# Patient Record
Sex: Female | Born: 1955 | Race: White | Hispanic: No | Smoking: Former smoker
Health system: Southern US, Community
[De-identification: ages and names within clinical notes are randomized; demographics above are authoritative.]

## PROBLEM LIST (undated history)

## (undated) DIAGNOSIS — F419 Anxiety disorder, unspecified: Secondary | ICD-10-CM

## (undated) DIAGNOSIS — R918 Other nonspecific abnormal finding of lung field: Secondary | ICD-10-CM

## (undated) DIAGNOSIS — E785 Hyperlipidemia, unspecified: Secondary | ICD-10-CM

## (undated) DIAGNOSIS — I739 Peripheral vascular disease, unspecified: Secondary | ICD-10-CM

## (undated) DIAGNOSIS — K8020 Calculus of gallbladder without cholecystitis without obstruction: Secondary | ICD-10-CM

## (undated) DIAGNOSIS — I2510 Atherosclerotic heart disease of native coronary artery without angina pectoris: Secondary | ICD-10-CM

## (undated) DIAGNOSIS — G4700 Insomnia, unspecified: Secondary | ICD-10-CM

## (undated) DIAGNOSIS — K219 Gastro-esophageal reflux disease without esophagitis: Secondary | ICD-10-CM

## (undated) DIAGNOSIS — M21619 Bunion of unspecified foot: Secondary | ICD-10-CM

## (undated) HISTORY — PX: OTHER SURGICAL HISTORY: SHX169

## (undated) HISTORY — DX: Hyperlipidemia, unspecified: E78.5

## (undated) HISTORY — DX: Atherosclerotic heart disease of native coronary artery without angina pectoris: I25.10

## (undated) HISTORY — DX: Anxiety disorder, unspecified: F41.9

## (undated) HISTORY — PX: EYE SURGERY: SHX253

## (undated) HISTORY — PX: CARDIAC CATHETERIZATION: SHX172

## (undated) HISTORY — PX: CORONARY STENT PLACEMENT: SHX1402

## (undated) HISTORY — DX: Other nonspecific abnormal finding of lung field: R91.8

## (undated) HISTORY — PX: BUNIONECTOMY: SHX129

## (undated) HISTORY — DX: Bunion of unspecified foot: M21.619

## (undated) HISTORY — DX: Insomnia, unspecified: G47.00

---

## 2012-03-02 ENCOUNTER — Other Ambulatory Visit (HOSPITAL_COMMUNITY)
Admission: RE | Admit: 2012-03-02 | Discharge: 2012-03-02 | Disposition: A | Discharge status: Home / Self Care | Payer: No Typology Code available for payment source | Source: Ambulatory Visit | Attending: Obstetrics and Gynecology | Admitting: Obstetrics and Gynecology

## 2012-03-02 DIAGNOSIS — Z124 Encounter for screening for malignant neoplasm of cervix: Secondary | ICD-10-CM | POA: Insufficient documentation

## 2016-06-03 DIAGNOSIS — F5101 Primary insomnia: Secondary | ICD-10-CM | POA: Insufficient documentation

## 2016-06-03 DIAGNOSIS — I2510 Atherosclerotic heart disease of native coronary artery without angina pectoris: Secondary | ICD-10-CM | POA: Insufficient documentation

## 2016-06-03 DIAGNOSIS — E7800 Pure hypercholesterolemia, unspecified: Secondary | ICD-10-CM | POA: Insufficient documentation

## 2016-06-03 DIAGNOSIS — I739 Peripheral vascular disease, unspecified: Secondary | ICD-10-CM | POA: Insufficient documentation

## 2016-07-09 DIAGNOSIS — I6523 Occlusion and stenosis of bilateral carotid arteries: Secondary | ICD-10-CM | POA: Insufficient documentation

## 2016-07-09 DIAGNOSIS — M21611 Bunion of right foot: Secondary | ICD-10-CM | POA: Insufficient documentation

## 2016-07-22 DIAGNOSIS — I2510 Atherosclerotic heart disease of native coronary artery without angina pectoris: Secondary | ICD-10-CM | POA: Insufficient documentation

## 2016-07-22 DIAGNOSIS — I70219 Atherosclerosis of native arteries of extremities with intermittent claudication, unspecified extremity: Secondary | ICD-10-CM | POA: Insufficient documentation

## 2016-07-22 DIAGNOSIS — Z95828 Presence of other vascular implants and grafts: Secondary | ICD-10-CM | POA: Insufficient documentation

## 2016-07-22 DIAGNOSIS — E785 Hyperlipidemia, unspecified: Secondary | ICD-10-CM | POA: Insufficient documentation

## 2016-10-10 DIAGNOSIS — I249 Acute ischemic heart disease, unspecified: Secondary | ICD-10-CM | POA: Insufficient documentation

## 2016-10-10 DIAGNOSIS — R918 Other nonspecific abnormal finding of lung field: Secondary | ICD-10-CM | POA: Insufficient documentation

## 2016-10-14 DIAGNOSIS — R079 Chest pain, unspecified: Secondary | ICD-10-CM | POA: Insufficient documentation

## 2016-10-29 ENCOUNTER — Institutional Professional Consult (permissible substitution) (INDEPENDENT_AMBULATORY_CARE_PROVIDER_SITE_OTHER): Payer: 59 | Admitting: Cardiothoracic Surgery

## 2016-10-29 ENCOUNTER — Encounter: Payer: Self-pay | Admitting: Cardiothoracic Surgery

## 2016-10-29 VITALS — BP 102/79 | HR 64 | Resp 16 | Ht 63.5 in | Wt 137.0 lb

## 2016-10-29 DIAGNOSIS — R918 Other nonspecific abnormal finding of lung field: Secondary | ICD-10-CM

## 2016-10-29 NOTE — Progress Notes (Signed)
PCP is Windell Hummingbird, PA-C Referring Provider is Gwenevere Ghazi, MD  Chief Complaint  Patient presents with  . Lung Mass    Surgical eval, PET Scan 10/17/16.Marland KitchenMarland KitchenCT C/A/P 10/10/16, MR BRAIN 10/22/16, PFT 10/17/16   Patient examined, outside CT scan of chest and PET scan and PFT report and brain MRI report personally reviewed and counseled with patient  HPI: Very nice active 61 year old Caucasian female presents for evaluation and therapy recently diagnosed 3 cm right lower lobe density with concern for primary lung cancer. The patient is a reformed smoker, stopped in 2008. At that time she was diagnosed with coronary artery disease and angina and underwent PCI of her LAD. She stopped smoking at that time.  Last month she presented to the [facility] for evaluation of anterior and posterior chest pain with radiation to her neck and jaw relieved by nitroglycerin. She was admitted for chest pain and to cycle cardiac enzymes all of which were negative. Her chest x-ray showed a abnormality in the right lung and subsequent CT scan of the chest was performed which showed the 3 cm density in the right lower lobe. She was discharged to be followed up by her cardiologist and by her pulmonologist Dr. Gwenevere Ghazi.  On approximately April 2 she underwent coronary angiography by Dr. Darral Dash at [facility] which showed a widely patent LAD stent normal coronaries and normal LV function according to the patient. Records will be obtained.  She is evaluated by Dr. Gwenevere Ghazi with PET scan which showed the right lower lobe density to have an SUV of 4.3 SUV. There is no evidence of other pulmonary or mediastinal nodal or distant foci of hypermetabolic activity. Brain MRI report is negative for metastatic disease. Morphologically the lesion is suspicious for a slow-growing adenocarcinoma. The patient presents today to discuss surgical resection. She is not interested and a needle biopsy to  establish the diagnosis first but would rather have a resection to definitively treat the abnormality.  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 She is on inhalers. She denies any active problems of shortness of breath productive cough hemoptysis or weight loss fever. There is no family history of lung cancer.  Patient denies previous history of pneumothorax, rib fracture, or aspiration. She works as a Optometrist. She enjoys and is active in her yard work. She also enjoys motorcycle rides.  2012 the patient had a stent placed in the left iliac artery. Within the past 3 months the patient had right bunion surgery and has recovered well.  Past Medical History:  Diagnosis Date  . Anxiety   . Bunion   . CAD (coronary artery disease)   . Hyperlipidemia   . Hypertension   . Insomnia   . Lung mass     Past Surgical History:  Procedure Laterality Date  . BUNIONECTOMY    . CARDIAC CATHETERIZATION    . CESAREAN SECTION    . CORONARY STENT PLACEMENT    . EYE SURGERY    . stent left leg 2012      Family History  Problem Relation Age of Onset  . Cancer Mother   . Diabetes Mother   . Heart disease Mother   . Hyperlipidemia Mother   . Hypertension Mother   . Kidney disease Mother   . Stroke Mother   . Cancer Father   . Stroke Father   . Heart disease Brother   . Diabetes Maternal Grandmother   . Hyperlipidemia  Maternal Grandmother   . Hypertension Maternal Grandmother   . Kidney disease Maternal Grandmother   . Heart disease Maternal Grandfather     Social History Social History  Substance Use Topics  . Smoking status: Former Smoker    Packs/day: 0.75    Years: 35.00    Quit date: 10/30/2006  . Smokeless tobacco: Never Used  . Alcohol use No    Current Outpatient Prescriptions  Medication Sig Dispense Refill  . ALPRAZolam (XANAX) 0.5 MG tablet TAKE ONE TABLET HS    . ANORO ELLIPTA 62.5-25 MCG/INH AEPB     . aspirin EC 81 MG tablet Take by mouth.     . gabapentin (NEURONTIN) 300 MG capsule Take 900 mg by mouth.    . metoprolol succinate (TOPROL-XL) 25 MG 24 hr tablet     . rosuvastatin (CRESTOR) 20 MG tablet nightly.      No current facility-administered medications for this visit.     Allergies  Allergen Reactions  . Lipitor [Atorvastatin]     MUSCLE ACHES    Review of Systems         Review of Systems :  [ y ] = yes, [  ] = no        General :  Weight gain [ yes  ]    Weight loss  [ no  ]  Fatigue [no  ]  Fever [no  ]  Chills  [ no ]                                Weakness  [ no           HEENT    Headache [  ]  Dizziness [  ]  Blurred vision [  ] Glaucoma  [  ]                          Nosebleeds [  ] Painful or loose teeth [  ]        Cardiac :  Chest pain/ pressure [ yes resolved ]  Resting SOB [  ] exertional SOB [  ]                        Orthopnea [  ]  Pedal edema  [  ]  Palpitations [  ] Syncope/presyncope '[ ]'$                         Paroxysmal nocturnal dyspnea [  ]         Pulmonary : cough [  ]  wheezing [  ]  Hemoptysis [  ] Sputum [  ] Snoring [  ]                              Pneumothorax [  ]  Sleep apnea [  ]        GI : Vomiting [  ]  Dysphagia [  ]  Melena  [  ]  Abdominal pain [  ] BRBPR [  ]              Heart burn [  ]  Constipation [  ] Diarrhea  [  ] Colonoscopy [ yes  ]        GU :  Hematuria [  ]  Dysuria [  ]  Nocturia [  ] UTI's [  ]        Vascular : Claudication [  ]  Rest pain [  ]  DVT [  ] Vein stripping [  ] leg ulcers [  ]                          TIA [  ] Stroke [  ]  Varicose veins [  ]left iliac stent 6 years ago without claudication        NEURO :  Headaches  [  ] Seizures [  ] Vision changes [  ] Paresthesias [  ]                                       Seizures [  ] right-hand-dominant        Musculoskeletal :  Arthritis [  ] Gout  [  ]  Back pain [  ]  Joint pain [  ]        Skin :  Rash [  ]  Melanoma [  ] Sores [  ]        Heme : Bleeding problems [  ]Clotting Disorders [  ]  Anemia [  ]Blood Transfusion '[ ]'$         Endocrine : Diabetes [  ] Heat or Cold intolerance [  ] Polyuria [  ]excessive thirst '[ ]'$         Psych : Depression [  ]  Anxiety [  ]  Psych hospitalizations [  ] Memory change [  ]                                               BP 102/79 (BP Location: Left Arm, Patient Position: Sitting, Cuff Size: Normal)   Pulse 64   Resp 16   Ht 5' 3.5" (1.613 m)   Wt 137 lb (62.1 kg)   SpO2 98% Comment: ON RA  BMI 23.89 kg/m  Physical Exam      Physical Exam  General: Very dynamic middle-aged female no acute distress HEENT: Normocephalic pupils equal , dentition adequate Neck: Supple without JVD, adenopathy, or bruit Chest: Clear to auscultation, symmetrical breath sounds, no rhonchi, no tenderness             or deformity Cardiovascular: Regular rate and rhythm, no murmur, no gallop, peripheral pulses             palpable in all extremities Abdomen:  Soft, nontender, no palpable mass or organomegaly Extremities: Warm, well-perfused, no clubbing cyanosis edema or tenderness,              no venous stasis changes of the legs, well-healed incision over right great toe bunion operation Rectal/GU: Deferred Neuro: Grossly non--focal and symmetrical throughout Skin: Clean and dry without rash or ulceration   Diagnostic Tests: CT scan, PET scan demonstrate a complex right lower lobe density with solid and sub- solid components. No evidence of mediastinal or distant metastatic disease. Appears to be probable early stage adenocarcinoma primary tumor.  Impression: We will need to obtain the cardiology records for this patient prior to surgery The patient will  be scheduled for full PFTs Right VATS and probable lobectomy will be scheduled for April 23 at [facility] hospital. I discussed the procedure in detail with the patient including the indications benefits and risks of bleeding, blood transfusion, postoperative air leak, postoperative pleural effusion,  and death. She agrees to proceed with surgery which I feel is the best option for treatment of this newly diagnosed 2-3 cm right lower lobe density  Plan: Preoperative testing with PFTs then VATS for resection April 23   Len Childs, MD [facility] [phone]

## 2016-10-30 ENCOUNTER — Other Ambulatory Visit: Payer: Self-pay | Admitting: *Deleted

## 2016-10-30 DIAGNOSIS — R911 Solitary pulmonary nodule: Secondary | ICD-10-CM

## 2016-10-31 NOTE — Pre-Procedure Instructions (Addendum)
[facility]  [birth year]      [id number] [place], [place] - [address] [place] [address] Phone: [phone] Fax: [phone]    Your procedure is scheduled on   Monday  11/04/16  Report to [facility] Admitting at 530 A.M.  Call this number if you have problems the morning of surgery:  [phone]   Remember:  Do not eat food or drink liquids after midnight.  Take these medicines the morning of surgery with A SIP OF WATER  none   STOP ASPIRIN,ANTIINFLAMATORIES (IBUPROFEN,ALEVE,MOTRIN,ADVIL,GOODY'S POWDERS),HERBAL SUPPLEMENTS,FISH OIL,AND VITAMINS 5-7 DAYS PRIOR TO SURGERY   Do not wear jewelry, make-up or nail polish.  Do not wear lotions, powders, or perfumes, or deoderant.  Do not shave 48 hours prior to surgery.  Men may shave face and neck.  Do not bring valuables to the hospital.  [facility] is not responsible for any belongings or valuables.  Contacts, dentures or bridgework may not be worn into surgery.  Leave your suitcase in the car.  After surgery it may be brought to your room.  For patients admitted to the hospital, discharge time will be determined by your treatment team.  Patients discharged the day of surgery will not be allowed to drive home.   Name and phone number of your driver:    Special instructions:  [facility] - Preparing for Surgery  Before surgery, you can play an important role.  Because skin is not sterile, your skin needs to be as free of germs as possible.  You can reduce the number of germs on you skin by washing with CHG (chlorahexidine gluconate) soap before surgery.  CHG is an antiseptic cleaner which kills germs and bonds with the skin to continue killing germs even after washing.  Please DO NOT use if you have an allergy to CHG or antibacterial soaps.  If your skin becomes reddened/irritated stop using the CHG and inform your nurse when you arrive at Short  Stay.  Do not shave (including legs and underarms) for at least 48 hours prior to the first CHG shower.  You may shave your face.  Please follow these instructions carefully:   1.  Shower with CHG Soap the night before surgery and the                                morning of Surgery.  2.  If you choose to wash your hair, wash your hair first as usual with your       normal shampoo.  3.  After you shampoo, rinse your hair and body thoroughly to remove the                      Shampoo.  4.  Use CHG as you would any other liquid soap.  You can apply chg directly       to the skin and wash gently with scrungie or a clean washcloth.  5.  Apply the CHG Soap to your body ONLY FROM THE NECK DOWN.        Do not use on open wounds or open sores.  Avoid contact with your eyes,       ears, mouth and genitals (private parts).  Wash genitals (private parts)       with your normal soap.  6.  Wash thoroughly, paying special attention to  the area where your surgery        will be performed.  7.  Thoroughly rinse your body with warm water from the neck down.  8.  DO NOT shower/wash with your normal soap after using and rinsing off       the CHG Soap.  9.  Pat yourself dry with a clean towel.            10.  Wear clean pajamas.            11.  Place clean sheets on your bed the night of your first shower and do not        sleep with pets.  Day of Surgery  Do not apply any lotions/deoderants the morning of surgery.  Please wear clean clothes to the hospital/surgery center.    Please read over the following fact sheets that you were given. MRSA Information and Surgical Site Infection Prevention

## 2016-11-01 ENCOUNTER — Ambulatory Visit (HOSPITAL_COMMUNITY)
Admission: RE | Admit: 2016-11-01 | Discharge: 2016-11-01 | Disposition: A | Discharge status: Home / Self Care | Payer: 59 | Source: Ambulatory Visit | Attending: Cardiothoracic Surgery | Admitting: Cardiothoracic Surgery

## 2016-11-01 ENCOUNTER — Encounter (HOSPITAL_COMMUNITY)
Admission: RE | Admit: 2016-11-01 | Discharge: 2016-11-01 | Disposition: A | Discharge status: Home / Self Care | Payer: 59 | Source: Ambulatory Visit | Attending: Cardiothoracic Surgery | Admitting: Cardiothoracic Surgery

## 2016-11-01 ENCOUNTER — Other Ambulatory Visit: Payer: Self-pay

## 2016-11-01 ENCOUNTER — Encounter (HOSPITAL_COMMUNITY): Payer: Self-pay

## 2016-11-01 DIAGNOSIS — R942 Abnormal results of pulmonary function studies: Secondary | ICD-10-CM

## 2016-11-01 DIAGNOSIS — R911 Solitary pulmonary nodule: Secondary | ICD-10-CM

## 2016-11-01 DIAGNOSIS — Z01818 Encounter for other preprocedural examination: Secondary | ICD-10-CM

## 2016-11-01 DIAGNOSIS — Z87891 Personal history of nicotine dependence: Secondary | ICD-10-CM

## 2016-11-01 DIAGNOSIS — J449 Chronic obstructive pulmonary disease, unspecified: Secondary | ICD-10-CM

## 2016-11-01 HISTORY — DX: Calculus of gallbladder without cholecystitis without obstruction: K80.20

## 2016-11-01 HISTORY — DX: Gastro-esophageal reflux disease without esophagitis: K21.9

## 2016-11-01 HISTORY — DX: Peripheral vascular disease, unspecified: I73.9

## 2016-11-01 LAB — PULMONARY FUNCTION TEST
DL/VA % pred: 76 %
DL/VA: 3.57 ml/min/mmHg/L
DLCO unc % pred: 70 %
DLCO unc: 16.07 ml/min/mmHg
FEF 25-75 Post: 1.48 L/sec
FEF 25-75 Pre: 1.69 L/sec
FEF2575-%Change-Post: -12 %
FEF2575-%Pred-Post: 65 %
FEF2575-%Pred-Pre: 74 %
FEV1-%Change-Post: -1 %
FEV1-%Pred-Post: 85 %
FEV1-%Pred-Pre: 87 %
FEV1-Post: 2.1 L
FEV1-Pre: 2.12 L
FEV1FVC-%Change-Post: -1 %
FEV1FVC-%Pred-Pre: 97 %
FEV6-%Change-Post: 0 %
FEV6-%Pred-Post: 90 %
FEV6-%Pred-Pre: 89 %
FEV6-Post: 2.75 L
FEV6-Pre: 2.74 L
FEV6FVC-%Change-Post: 0 %
FEV6FVC-%Pred-Post: 102 %
FEV6FVC-%Pred-Pre: 103 %
FVC-%Change-Post: 0 %
FVC-%Pred-Post: 88 %
FVC-%Pred-Pre: 87 %
FVC-Post: 2.79 L
FVC-Pre: 2.78 L
Post FEV1/FVC ratio: 75 %
Post FEV6/FVC ratio: 99 %
Pre FEV1/FVC ratio: 76 %
Pre FEV6/FVC Ratio: 99 %
RV % pred: 113 %
RV: 2.21 L
TLC % pred: 100 %
TLC: 4.91 L

## 2016-11-01 LAB — URINALYSIS, ROUTINE W REFLEX MICROSCOPIC
Bilirubin Urine: NEGATIVE
Glucose, UA: NEGATIVE mg/dL
Hgb urine dipstick: NEGATIVE
Ketones, ur: NEGATIVE mg/dL
Leukocytes, UA: NEGATIVE
Nitrite: NEGATIVE
Protein, ur: NEGATIVE mg/dL
Specific Gravity, Urine: 1.002 — ABNORMAL LOW (ref 1.005–1.030)
pH: 5 (ref 5.0–8.0)

## 2016-11-01 LAB — BLOOD GAS, ARTERIAL
Acid-base deficit: 1 mmol/L (ref 0.0–2.0)
Bicarbonate: 22.6 mmol/L (ref 20.0–28.0)
Drawn by: 449841
FIO2: 21
O2 Saturation: 97.1 %
Patient temperature: 98.6
pCO2 arterial: 33.9 mmHg (ref 32.0–48.0)
pH, Arterial: 7.439 (ref 7.350–7.450)
pO2, Arterial: 91.3 mmHg (ref 83.0–108.0)

## 2016-11-01 LAB — COMPREHENSIVE METABOLIC PANEL
ALT: 25 U/L (ref 14–54)
AST: 27 U/L (ref 15–41)
Albumin: 3.9 g/dL (ref 3.5–5.0)
Alkaline Phosphatase: 40 U/L (ref 38–126)
Anion gap: 8 (ref 5–15)
BUN: 12 mg/dL (ref 6–20)
CO2: 21 mmol/L — ABNORMAL LOW (ref 22–32)
Calcium: 9.6 mg/dL (ref 8.9–10.3)
Chloride: 110 mmol/L (ref 101–111)
Creatinine, Ser: 0.83 mg/dL (ref 0.44–1.00)
GFR calc Af Amer: >60 mL/min (ref >60)
GFR calc non Af Amer: >60 mL/min (ref >60)
Glucose, Bld: 103 mg/dL — ABNORMAL HIGH (ref 65–99)
Potassium: 4.1 mmol/L (ref 3.5–5.1)
Sodium: 139 mmol/L (ref 135–145)
Total Bilirubin: 0.6 mg/dL (ref 0.3–1.2)
Total Protein: 6.4 g/dL — ABNORMAL LOW (ref 6.5–8.1)

## 2016-11-01 LAB — CBC
HCT: 40.9 % (ref 36.0–46.0)
Hemoglobin: 13.4 g/dL (ref 12.0–15.0)
MCH: 29.8 pg (ref 26.0–34.0)
MCHC: 32.8 g/dL (ref 30.0–36.0)
MCV: 90.9 fL (ref 78.0–100.0)
Platelets: 233 10*3/uL (ref 150–400)
RBC: 4.5 MIL/uL (ref 3.87–5.11)
RDW: 13.5 % (ref 11.5–15.5)
WBC: 7 10*3/uL (ref 4.0–10.5)

## 2016-11-01 LAB — SURGICAL PCR SCREEN
MRSA, PCR: NEGATIVE
Staphylococcus aureus: POSITIVE — AB

## 2016-11-01 LAB — APTT: aPTT: 28 seconds (ref 24–36)

## 2016-11-01 LAB — PROTIME-INR
INR: 0.88
Prothrombin Time: 12 seconds (ref 11.4–15.2)

## 2016-11-01 LAB — ABO/RH: ABO/RH(D): A POS

## 2016-11-01 MED ORDER — ALBUTEROL SULFATE (2.5 MG/3ML) 0.083% IN NEBU
2.5000 mg | INHALATION_SOLUTION | Freq: Once | RESPIRATORY_TRACT | Status: AC
  Administered 2016-11-01: 2.5 mg via RESPIRATORY_TRACT

## 2016-11-01 NOTE — Progress Notes (Signed)
Dr. Gertie Fey   Cardiologist  Dr. Maryjean Morn  Vascular surgeon  Cardiac cath  2018  [facility]   Dr. Blenda Mounts  PCP [facility]

## 2016-11-01 NOTE — Progress Notes (Signed)
Pt. Notified of results of nasal swab.Px. For mupirocin called to [facility] on [address], [place]

## 2016-11-04 ENCOUNTER — Encounter (HOSPITAL_COMMUNITY): Payer: Self-pay | Admitting: *Deleted

## 2016-11-04 ENCOUNTER — Inpatient Hospital Stay (HOSPITAL_COMMUNITY): Payer: 59

## 2016-11-04 ENCOUNTER — Encounter (HOSPITAL_COMMUNITY)
Admission: RE | Disposition: A | Discharge status: Home / Self Care | Payer: Self-pay | Source: Ambulatory Visit | Attending: Cardiothoracic Surgery

## 2016-11-04 ENCOUNTER — Inpatient Hospital Stay (HOSPITAL_COMMUNITY)
Admission: RE | Admit: 2016-11-04 | Discharge: 2016-11-08 | DRG: 164 | Disposition: A | Discharge status: Home / Self Care | Payer: 59 | Source: Ambulatory Visit | Attending: Cardiothoracic Surgery | Admitting: Cardiothoracic Surgery

## 2016-11-04 ENCOUNTER — Inpatient Hospital Stay (HOSPITAL_COMMUNITY): Payer: 59 | Admitting: Anesthesiology

## 2016-11-04 DIAGNOSIS — Y838 Other surgical procedures as the cause of abnormal reaction of the patient, or of later complication, without mention of misadventure at the time of the procedure: Secondary | ICD-10-CM | POA: Diagnosis not present

## 2016-11-04 DIAGNOSIS — Z79899 Other long term (current) drug therapy: Secondary | ICD-10-CM | POA: Diagnosis not present

## 2016-11-04 DIAGNOSIS — J9382 Other air leak: Secondary | ICD-10-CM | POA: Diagnosis not present

## 2016-11-04 DIAGNOSIS — K219 Gastro-esophageal reflux disease without esophagitis: Secondary | ICD-10-CM | POA: Diagnosis present

## 2016-11-04 DIAGNOSIS — J449 Chronic obstructive pulmonary disease, unspecified: Secondary | ICD-10-CM | POA: Diagnosis present

## 2016-11-04 DIAGNOSIS — Z09 Encounter for follow-up examination after completed treatment for conditions other than malignant neoplasm: Secondary | ICD-10-CM

## 2016-11-04 DIAGNOSIS — R911 Solitary pulmonary nodule: Secondary | ICD-10-CM

## 2016-11-04 DIAGNOSIS — Z955 Presence of coronary angioplasty implant and graft: Secondary | ICD-10-CM

## 2016-11-04 DIAGNOSIS — I2510 Atherosclerotic heart disease of native coronary artery without angina pectoris: Secondary | ICD-10-CM | POA: Diagnosis present

## 2016-11-04 DIAGNOSIS — Z87891 Personal history of nicotine dependence: Secondary | ICD-10-CM | POA: Diagnosis not present

## 2016-11-04 DIAGNOSIS — Z7982 Long term (current) use of aspirin: Secondary | ICD-10-CM

## 2016-11-04 DIAGNOSIS — R000 Tachycardia, unspecified: Secondary | ICD-10-CM | POA: Diagnosis present

## 2016-11-04 DIAGNOSIS — Z888 Allergy status to other drugs, medicaments and biological substances status: Secondary | ICD-10-CM | POA: Diagnosis not present

## 2016-11-04 DIAGNOSIS — C3431 Malignant neoplasm of lower lobe, right bronchus or lung: Principal | ICD-10-CM | POA: Diagnosis present

## 2016-11-04 DIAGNOSIS — Y92234 Operating room of hospital as the place of occurrence of the external cause: Secondary | ICD-10-CM | POA: Diagnosis not present

## 2016-11-04 DIAGNOSIS — Z9689 Presence of other specified functional implants: Secondary | ICD-10-CM

## 2016-11-04 DIAGNOSIS — J95811 Postprocedural pneumothorax: Secondary | ICD-10-CM | POA: Diagnosis not present

## 2016-11-04 DIAGNOSIS — F419 Anxiety disorder, unspecified: Secondary | ICD-10-CM | POA: Diagnosis present

## 2016-11-04 DIAGNOSIS — Z902 Acquired absence of lung [part of]: Secondary | ICD-10-CM

## 2016-11-04 DIAGNOSIS — R918 Other nonspecific abnormal finding of lung field: Secondary | ICD-10-CM | POA: Diagnosis present

## 2016-11-04 HISTORY — PX: VIDEO ASSISTED THORACOSCOPY (VATS)/ LOBECTOMY: SHX6169

## 2016-11-04 HISTORY — PX: VIDEO BRONCHOSCOPY: SHX5072

## 2016-11-04 HISTORY — PX: CHEST TUBE INSERTION: SHX231

## 2016-11-04 LAB — POCT I-STAT 7, (LYTES, BLD GAS, ICA,H+H)
Acid-base deficit: 4 mmol/L — ABNORMAL HIGH (ref 0.0–2.0)
Bicarbonate: 24 mmol/L (ref 20.0–28.0)
Calcium, Ion: 1.22 mmol/L (ref 1.15–1.40)
HCT: 41 % (ref 36.0–46.0)
Hemoglobin: 13.9 g/dL (ref 12.0–15.0)
O2 Saturation: 97 %
Potassium: 4.2 mmol/L (ref 3.5–5.1)
Sodium: 142 mmol/L (ref 135–145)
TCO2: 26 mmol/L (ref 0–100)
pCO2 arterial: 52.9 mmHg — ABNORMAL HIGH (ref 32.0–48.0)
pH, Arterial: 7.264 — ABNORMAL LOW (ref 7.350–7.450)
pO2, Arterial: 103 mmHg (ref 83.0–108.0)

## 2016-11-04 LAB — PULMONARY FUNCTION TEST

## 2016-11-04 LAB — URINALYSIS, ROUTINE W REFLEX MICROSCOPIC
Bilirubin Urine: NEGATIVE
GLUCOSE, UA: 50 mg/dL — AB
Ketones, ur: NEGATIVE mg/dL
NITRITE: NEGATIVE
PH: 6 (ref 5.0–8.0)
Protein, ur: NEGATIVE mg/dL
SPECIFIC GRAVITY, URINE: 1.023 (ref 1.005–1.030)

## 2016-11-04 LAB — PREPARE RBC (CROSSMATCH)

## 2016-11-04 SURGERY — BRONCHOSCOPY, VIDEO-ASSISTED
Anesthesia: General | Site: Chest | Laterality: Right

## 2016-11-04 MED ORDER — ROCURONIUM BROMIDE 100 MG/10ML IV SOLN
INTRAVENOUS | Status: DC | PRN
  Administered 2016-11-04: 50 mg via INTRAVENOUS
  Administered 2016-11-04: 20 mg via INTRAVENOUS
  Administered 2016-11-04: 10 mg via INTRAVENOUS

## 2016-11-04 MED ORDER — BUPIVACAINE 0.5 % ON-Q PUMP SINGLE CATH 400 ML
400.0000 mL | INJECTION | Status: AC
  Administered 2016-11-04: 400 mL
  Filled 2016-11-04: qty 400

## 2016-11-04 MED ORDER — MIDAZOLAM HCL 2 MG/2ML IJ SOLN
INTRAMUSCULAR | Status: AC
  Filled 2016-11-04: qty 2

## 2016-11-04 MED ORDER — LUNG SURGERY BOOK
Freq: Once | Status: AC
  Administered 2016-11-04: 1
  Filled 2016-11-04: qty 1

## 2016-11-04 MED ORDER — MIDAZOLAM HCL 5 MG/5ML IJ SOLN
INTRAMUSCULAR | Status: DC | PRN
  Administered 2016-11-04 (×2): 1 mg via INTRAVENOUS

## 2016-11-04 MED ORDER — FENTANYL CITRATE (PF) 250 MCG/5ML IJ SOLN
INTRAMUSCULAR | Status: AC
  Filled 2016-11-04: qty 5

## 2016-11-04 MED ORDER — FENTANYL 40 MCG/ML IV SOLN
INTRAVENOUS | Status: DC
  Administered 2016-11-04: 30 ug via INTRAVENOUS
  Administered 2016-11-04: 60 ug via INTRAVENOUS
  Administered 2016-11-04: 1000 ug via INTRAVENOUS
  Administered 2016-11-05: 105 ug via INTRAVENOUS
  Administered 2016-11-05: 75 ug via INTRAVENOUS
  Administered 2016-11-06: 105 ug via INTRAVENOUS
  Administered 2016-11-06: 06:00:00 via INTRAVENOUS
  Filled 2016-11-04 (×2): qty 25

## 2016-11-04 MED ORDER — PHENYLEPHRINE HCL 10 MG/ML IJ SOLN
INTRAMUSCULAR | Status: DC | PRN
  Administered 2016-11-04: 120 ug via INTRAVENOUS
  Administered 2016-11-04: 40 ug via INTRAVENOUS

## 2016-11-04 MED ORDER — SUGAMMADEX SODIUM 200 MG/2ML IV SOLN
INTRAVENOUS | Status: AC
Start: 2016-11-04 — End: 2016-11-04
  Filled 2016-11-04: qty 2

## 2016-11-04 MED ORDER — NALOXONE HCL 0.4 MG/ML IJ SOLN
0.4000 mg | INTRAMUSCULAR | Status: DC | PRN
  Administered 2016-11-04: 0.2 mg via INTRAVENOUS

## 2016-11-04 MED ORDER — FENTANYL CITRATE (PF) 100 MCG/2ML IJ SOLN
INTRAMUSCULAR | Status: DC | PRN
  Administered 2016-11-04 (×8): 50 ug via INTRAVENOUS
  Administered 2016-11-04: 100 ug via INTRAVENOUS
  Administered 2016-11-04: 50 ug via INTRAVENOUS

## 2016-11-04 MED ORDER — 0.9 % SODIUM CHLORIDE (POUR BTL) OPTIME
TOPICAL | Status: DC | PRN
  Administered 2016-11-04: 2000 mL

## 2016-11-04 MED ORDER — LACTATED RINGERS IV SOLN
INTRAVENOUS | Status: DC | PRN
  Administered 2016-11-04: 07:00:00 via INTRAVENOUS

## 2016-11-04 MED ORDER — MEPERIDINE HCL 25 MG/ML IJ SOLN: 6.2500 mg | INTRAMUSCULAR | Status: DC | PRN

## 2016-11-04 MED ORDER — OXYCODONE HCL 5 MG PO TABS
5.0000 mg | ORAL_TABLET | ORAL | Status: DC | PRN
  Administered 2016-11-06: 10 mg via ORAL
  Administered 2016-11-06 (×4): 5 mg via ORAL
  Filled 2016-11-04: qty 1
  Filled 2016-11-04 (×3): qty 2

## 2016-11-04 MED ORDER — METOCLOPRAMIDE HCL 5 MG/ML IJ SOLN
10.0000 mg | Freq: Four times a day (QID) | INTRAMUSCULAR | Status: DC
  Administered 2016-11-04 – 2016-11-06 (×7): 10 mg via INTRAVENOUS
  Filled 2016-11-04 (×7): qty 2

## 2016-11-04 MED ORDER — ALBUTEROL SULFATE (2.5 MG/3ML) 0.083% IN NEBU
2.5000 mg | INHALATION_SOLUTION | RESPIRATORY_TRACT | Status: DC
  Filled 2016-11-04: qty 3

## 2016-11-04 MED ORDER — MUPIROCIN 2 % EX OINT
1.0000 "application " | TOPICAL_OINTMENT | Freq: Two times a day (BID) | CUTANEOUS | Status: DC
  Administered 2016-11-04 – 2016-11-06 (×5): 1 via NASAL
  Filled 2016-11-04 (×4): qty 22

## 2016-11-04 MED ORDER — BUPIVACAINE HCL (PF) 0.5 % IJ SOLN
INTRAMUSCULAR | Status: AC
  Filled 2016-11-04: qty 30

## 2016-11-04 MED ORDER — ARTIFICIAL TEARS OP OINT
TOPICAL_OINTMENT | OPHTHALMIC | Status: DC | PRN
  Administered 2016-11-04: 1 via OPHTHALMIC

## 2016-11-04 MED ORDER — BUPIVACAINE HCL (PF) 0.5 % IJ SOLN
INTRAMUSCULAR | Status: DC | PRN
  Administered 2016-11-04: 5 mL

## 2016-11-04 MED ORDER — CEFUROXIME SODIUM 1.5 G IJ SOLR
1.5000 g | Freq: Two times a day (BID) | INTRAMUSCULAR | Status: AC
  Administered 2016-11-04 – 2016-11-05 (×2): 1.5 g via INTRAVENOUS
  Filled 2016-11-04 (×2): qty 1.5

## 2016-11-04 MED ORDER — ONDANSETRON HCL 4 MG/2ML IJ SOLN
INTRAMUSCULAR | Status: AC
  Filled 2016-11-04: qty 2

## 2016-11-04 MED ORDER — ACETAMINOPHEN 500 MG PO TABS
1000.0000 mg | ORAL_TABLET | Freq: Four times a day (QID) | ORAL | Status: DC
  Administered 2016-11-04 – 2016-11-08 (×14): 1000 mg via ORAL
  Filled 2016-11-04 (×13): qty 2

## 2016-11-04 MED ORDER — ONDANSETRON HCL 4 MG/2ML IJ SOLN
INTRAMUSCULAR | Status: DC | PRN
  Administered 2016-11-04: 4 mg via INTRAVENOUS

## 2016-11-04 MED ORDER — NALOXONE HCL 0.4 MG/ML IJ SOLN
INTRAMUSCULAR | Status: AC
Start: 2016-11-04 — End: 2016-11-04
  Administered 2016-11-04: 0.2 mg via INTRAVENOUS
  Filled 2016-11-04: qty 1

## 2016-11-04 MED ORDER — PHENYLEPHRINE HCL 10 MG/ML IJ SOLN
INTRAVENOUS | Status: DC | PRN
  Administered 2016-11-04: 20 ug/min via INTRAVENOUS

## 2016-11-04 MED ORDER — ACETAMINOPHEN 160 MG/5ML PO SOLN
1000.0000 mg | Freq: Four times a day (QID) | ORAL | Status: DC
  Filled 2016-11-04: qty 40.6

## 2016-11-04 MED ORDER — PROPOFOL 10 MG/ML IV BOLUS
INTRAVENOUS | Status: AC
Start: 2016-11-04 — End: 2016-11-04
  Filled 2016-11-04: qty 20

## 2016-11-04 MED ORDER — HYDROMORPHONE HCL 1 MG/ML IJ SOLN
INTRAMUSCULAR | Status: AC
  Administered 2016-11-04: 0.5 mg via INTRAVENOUS
  Filled 2016-11-04: qty 1

## 2016-11-04 MED ORDER — PROPOFOL 10 MG/ML IV BOLUS
INTRAVENOUS | Status: DC | PRN
  Administered 2016-11-04: 100 mg via INTRAVENOUS

## 2016-11-04 MED ORDER — SENNOSIDES-DOCUSATE SODIUM 8.6-50 MG PO TABS
1.0000 | ORAL_TABLET | Freq: Every day | ORAL | Status: DC
  Administered 2016-11-07: 1 via ORAL
  Filled 2016-11-04 (×2): qty 1

## 2016-11-04 MED ORDER — LIDOCAINE 2% (20 MG/ML) 5 ML SYRINGE
INTRAMUSCULAR | Status: AC
  Filled 2016-11-04: qty 5

## 2016-11-04 MED ORDER — DIPHENHYDRAMINE HCL 12.5 MG/5ML PO ELIX: 12.5000 mg | ORAL_SOLUTION | Freq: Four times a day (QID) | ORAL | Status: DC | PRN

## 2016-11-04 MED ORDER — SUGAMMADEX SODIUM 200 MG/2ML IV SOLN
INTRAVENOUS | Status: DC | PRN
  Administered 2016-11-04: 150 mg via INTRAVENOUS

## 2016-11-04 MED ORDER — KETOROLAC TROMETHAMINE 15 MG/ML IJ SOLN
INTRAMUSCULAR | Status: AC
  Administered 2016-11-04: 15 mg via INTRAVENOUS
  Filled 2016-11-04: qty 1

## 2016-11-04 MED ORDER — PANTOPRAZOLE SODIUM 40 MG PO TBEC
40.0000 mg | DELAYED_RELEASE_TABLET | Freq: Every day | ORAL | Status: DC
  Administered 2016-11-04 – 2016-11-08 (×5): 40 mg via ORAL
  Filled 2016-11-04 (×5): qty 1

## 2016-11-04 MED ORDER — ONDANSETRON HCL 4 MG/2ML IJ SOLN: 4.0000 mg | Freq: Four times a day (QID) | INTRAMUSCULAR | Status: DC | PRN

## 2016-11-04 MED ORDER — POTASSIUM CHLORIDE 2 MEQ/ML IV SOLN
30.0000 meq | Freq: Every day | INTRAVENOUS | Status: DC | PRN
  Filled 2016-11-04: qty 15

## 2016-11-04 MED ORDER — ALBUTEROL SULFATE (2.5 MG/3ML) 0.083% IN NEBU: 2.5000 mg | INHALATION_SOLUTION | RESPIRATORY_TRACT | Status: DC | PRN

## 2016-11-04 MED ORDER — BISACODYL 5 MG PO TBEC
10.0000 mg | DELAYED_RELEASE_TABLET | Freq: Every day | ORAL | Status: DC
Start: 2016-11-04 — End: 2016-11-08
  Administered 2016-11-05 – 2016-11-07 (×2): 10 mg via ORAL
  Filled 2016-11-04 (×3): qty 2

## 2016-11-04 MED ORDER — DIPHENHYDRAMINE HCL 50 MG/ML IJ SOLN
12.5000 mg | Freq: Four times a day (QID) | INTRAMUSCULAR | Status: DC | PRN
  Administered 2016-11-05: 12.5 mg via INTRAVENOUS
  Filled 2016-11-04: qty 1

## 2016-11-04 MED ORDER — PNEUMOCOCCAL VAC POLYVALENT 25 MCG/0.5ML IJ INJ: 0.5000 mL | INJECTION | INTRAMUSCULAR | Status: DC

## 2016-11-04 MED ORDER — DEXTROSE 5 % IV SOLN
1.5000 g | INTRAVENOUS | Status: AC
  Administered 2016-11-04: 1.5 g via INTRAVENOUS
  Filled 2016-11-04: qty 1.5

## 2016-11-04 MED ORDER — ONDANSETRON HCL 4 MG/2ML IJ SOLN: 4.0000 mg | Freq: Once | INTRAMUSCULAR | Status: DC | PRN

## 2016-11-04 MED ORDER — DEXTROSE-NACL 5-0.9 % IV SOLN
INTRAVENOUS | Status: DC
  Administered 2016-11-04 (×2): via INTRAVENOUS

## 2016-11-04 MED ORDER — LIDOCAINE HCL (CARDIAC) 20 MG/ML IV SOLN
INTRAVENOUS | Status: DC | PRN
  Administered 2016-11-04: 100 mg via INTRAVENOUS

## 2016-11-04 MED ORDER — BUPIVACAINE 0.5 % ON-Q PUMP SINGLE CATH 400 ML: INJECTION | Status: DC | PRN

## 2016-11-04 MED ORDER — TRAMADOL HCL 50 MG PO TABS
50.0000 mg | ORAL_TABLET | Freq: Four times a day (QID) | ORAL | Status: DC | PRN
  Administered 2016-11-06 – 2016-11-07 (×4): 50 mg via ORAL
  Filled 2016-11-04 (×2): qty 1
  Filled 2016-11-04: qty 2
  Filled 2016-11-04: qty 1

## 2016-11-04 MED ORDER — KETOROLAC TROMETHAMINE 15 MG/ML IJ SOLN
15.0000 mg | Freq: Four times a day (QID) | INTRAMUSCULAR | Status: AC
  Administered 2016-11-04 – 2016-11-06 (×8): 15 mg via INTRAVENOUS
  Filled 2016-11-04 (×7): qty 1

## 2016-11-04 MED ORDER — ROCURONIUM BROMIDE 10 MG/ML (PF) SYRINGE
PREFILLED_SYRINGE | INTRAVENOUS | Status: AC
  Filled 2016-11-04: qty 5

## 2016-11-04 MED ORDER — SODIUM CHLORIDE 0.9% FLUSH: 9.0000 mL | INTRAVENOUS | Status: DC | PRN

## 2016-11-04 MED ORDER — LACTATED RINGERS IV SOLN
INTRAVENOUS | Status: DC | PRN
Start: 2016-11-04 — End: 2016-11-04
  Administered 2016-11-04: 07:00:00 via INTRAVENOUS

## 2016-11-04 MED ORDER — HYDROMORPHONE HCL 1 MG/ML IJ SOLN
0.2500 mg | INTRAMUSCULAR | Status: DC | PRN
  Administered 2016-11-04 (×3): 0.5 mg via INTRAVENOUS

## 2016-11-04 MED ORDER — SIMETHICONE 80 MG PO CHEW
80.0000 mg | CHEWABLE_TABLET | Freq: Four times a day (QID) | ORAL | Status: DC
  Administered 2016-11-04 – 2016-11-07 (×13): 80 mg via ORAL
  Filled 2016-11-04 (×14): qty 1

## 2016-11-04 SURGICAL SUPPLY — 100 items
APPLICATOR TIP EXT COSEAL (VASCULAR PRODUCTS) IMPLANT
BAG DECANTER FOR FLEXI CONT (MISCELLANEOUS) IMPLANT
BLADE SURG 11 STRL SS (BLADE) ×4 IMPLANT
BRUSH CYTOL CELLEBRITY 1.5X140 (MISCELLANEOUS) IMPLANT
CANISTER SUCT 3000ML PPV (MISCELLANEOUS) ×4 IMPLANT
CATH KIT ON Q 5IN SLV (PAIN MANAGEMENT) IMPLANT
CATH KIT ON-Q SILVERSOAK 5IN (CATHETERS) ×4 IMPLANT
CATH THORACIC 28FR (CATHETERS) ×4 IMPLANT
CATH THORACIC 36FR (CATHETERS) IMPLANT
CATH THORACIC 36FR RT ANG (CATHETERS) IMPLANT
CLIP TI MEDIUM 24 (CLIP) ×4 IMPLANT
CONN ST 1/4X3/8  BEN (MISCELLANEOUS) ×2
CONN ST 1/4X3/8 BEN (MISCELLANEOUS) ×2 IMPLANT
CONN Y 3/8X3/8X3/8  BEN (MISCELLANEOUS)
CONN Y 3/8X3/8X3/8 BEN (MISCELLANEOUS) IMPLANT
CONT SPEC 4OZ CLIKSEAL STRL BL (MISCELLANEOUS) ×16 IMPLANT
COTTONBALL LRG STERILE PKG (GAUZE/BANDAGES/DRESSINGS) ×4 IMPLANT
COVER BACK TABLE 60X90IN (DRAPES) IMPLANT
DERMABOND ADVANCED (GAUZE/BANDAGES/DRESSINGS) ×2
DERMABOND ADVANCED .7 DNX12 (GAUZE/BANDAGES/DRESSINGS) ×2 IMPLANT
DRAIN CHANNEL 32F RND 10.7 FF (WOUND CARE) ×4 IMPLANT
DRAPE HALF SHEET 40X57 (DRAPES) ×4 IMPLANT
DRAPE LAPAROSCOPIC ABDOMINAL (DRAPES) ×4 IMPLANT
DRAPE SLUSH/WARMER DISC (DRAPES) ×4 IMPLANT
DRAPE WARM FLUID 44X44 (DRAPE) IMPLANT
ELECT BLADE 4.0 EZ CLEAN MEGAD (MISCELLANEOUS) ×4
ELECT REM PT RETURN 9FT ADLT (ELECTROSURGICAL) ×4
ELECTRODE BLDE 4.0 EZ CLN MEGD (MISCELLANEOUS) ×2 IMPLANT
ELECTRODE REM PT RTRN 9FT ADLT (ELECTROSURGICAL) ×2 IMPLANT
FELT TEFLON 1X6 (MISCELLANEOUS) ×4 IMPLANT
FORCEPS BIOP RJ4 1.8 (CUTTING FORCEPS) IMPLANT
GAUZE SPONGE 4X4 12PLY STRL (GAUZE/BANDAGES/DRESSINGS) ×4 IMPLANT
GAUZE SPONGE 4X4 12PLY STRL LF (GAUZE/BANDAGES/DRESSINGS) ×4 IMPLANT
GLOVE BIO SURGEON STRL SZ7.5 (GLOVE) ×8 IMPLANT
GOWN STRL REUS W/ TWL LRG LVL3 (GOWN DISPOSABLE) ×8 IMPLANT
GOWN STRL REUS W/TWL LRG LVL3 (GOWN DISPOSABLE) ×8
HANDLE STAPLE ENDO GIA SHORT (STAPLE)
HEMOSTAT SURGICEL 2X14 (HEMOSTASIS) IMPLANT
KIT BASIN OR (CUSTOM PROCEDURE TRAY) ×4 IMPLANT
KIT CLEAN ENDO COMPLIANCE (KITS) ×4 IMPLANT
KIT ROOM TURNOVER OR (KITS) ×4 IMPLANT
KIT SUCTION CATH 14FR (SUCTIONS) ×4 IMPLANT
LOOP VESSEL MINI RED (MISCELLANEOUS) ×4 IMPLANT
MARKER SKIN DUAL TIP RULER LAB (MISCELLANEOUS) IMPLANT
NEEDLE BLUNT 18X1 FOR OR ONLY (NEEDLE) IMPLANT
NEEDLE HYPO 22GX1.5 SAFETY (NEEDLE) IMPLANT
NS IRRIG 1000ML POUR BTL (IV SOLUTION) ×8 IMPLANT
OIL SILICONE PENTAX (PARTS (SERVICE/REPAIRS)) ×4 IMPLANT
PACK CHEST (CUSTOM PROCEDURE TRAY) ×4 IMPLANT
PAD ARMBOARD 7.5X6 YLW CONV (MISCELLANEOUS) ×12 IMPLANT
RELOAD GREEN ECHELON 45 (STAPLE) ×4 IMPLANT
SEALANT PROGEL (MISCELLANEOUS) IMPLANT
SEALANT SURG COSEAL 4ML (VASCULAR PRODUCTS) ×8 IMPLANT
SEALANT SURG COSEAL 8ML (VASCULAR PRODUCTS) IMPLANT
SOLUTION ANTI FOG 6CC (MISCELLANEOUS) ×4 IMPLANT
SPONGE INTESTINAL PEANUT (DISPOSABLE) ×16 IMPLANT
SPONGE TONSIL 1.25 RF SGL STRG (GAUZE/BANDAGES/DRESSINGS) ×12 IMPLANT
STAPLE RELOAD 2.5MM WHITE (STAPLE) ×16 IMPLANT
STAPLE RELOAD 45MM GOLD (STAPLE) ×20 IMPLANT
STAPLER ECHELON POWERED (MISCELLANEOUS) ×4 IMPLANT
STAPLER ENDO GIA 12MM SHORT (STAPLE) IMPLANT
STAPLER TA30 4.8 NON-ABS (STAPLE) IMPLANT
STAPLER VASCULAR ECHELON 35 (CUTTER) ×4 IMPLANT
SUT CHROMIC 3 0 SH 27 (SUTURE) IMPLANT
SUT ETHILON 3 0 PS 1 (SUTURE) ×4 IMPLANT
SUT PROLENE 3 0 SH DA (SUTURE) IMPLANT
SUT PROLENE 4 0 RB 1 (SUTURE) ×2
SUT PROLENE 4-0 RB1 .5 CRCL 36 (SUTURE) ×2 IMPLANT
SUT PROLENE 6 0 C 1 30 (SUTURE) IMPLANT
SUT SILK  1 MH (SUTURE) ×4
SUT SILK 1 MH (SUTURE) ×4 IMPLANT
SUT SILK 1 TIES 10X30 (SUTURE) IMPLANT
SUT SILK 2 0SH CR/8 30 (SUTURE) ×4 IMPLANT
SUT SILK 3 0SH CR/8 30 (SUTURE) IMPLANT
SUT VIC AB 1 CTX 18 (SUTURE) ×8 IMPLANT
SUT VIC AB 2 TP1 27 (SUTURE) IMPLANT
SUT VIC AB 2-0 CTX 36 (SUTURE) ×4 IMPLANT
SUT VIC AB 3-0 SH 18 (SUTURE) IMPLANT
SUT VIC AB 3-0 X1 27 (SUTURE) ×4 IMPLANT
SUT VICRYL 0 UR6 27IN ABS (SUTURE) ×8 IMPLANT
SUT VICRYL 2 TP 1 (SUTURE) ×4 IMPLANT
SWAB COLLECTION DEVICE MRSA (MISCELLANEOUS) IMPLANT
SWAB CULTURE ESWAB REG 1ML (MISCELLANEOUS) IMPLANT
SYR 10ML LL (SYRINGE) ×3 IMPLANT
SYR 20ML ECCENTRIC (SYRINGE) ×4 IMPLANT
SYR 5ML LUER SLIP (SYRINGE) ×4 IMPLANT
SYR CONTROL 10ML LL (SYRINGE) IMPLANT
SYSTEM SAHARA CHEST DRAIN ATS (WOUND CARE) ×4 IMPLANT
TAPE CLOTH SURG 4X10 WHT LF (GAUZE/BANDAGES/DRESSINGS) ×4 IMPLANT
TIP APPLICATOR SPRAY EXTEND 16 (VASCULAR PRODUCTS) IMPLANT
TOWEL GREEN STERILE (TOWEL DISPOSABLE) ×4 IMPLANT
TOWEL GREEN STERILE FF (TOWEL DISPOSABLE) IMPLANT
TOWEL OR 17X24 6PK STRL BLUE (TOWEL DISPOSABLE) IMPLANT
TOWEL OR 17X26 10 PK STRL BLUE (TOWEL DISPOSABLE) IMPLANT
TRAP SPECIMEN MUCOUS 40CC (MISCELLANEOUS) ×4 IMPLANT
TRAY FOLEY W/METER SILVER 16FR (SET/KITS/TRAYS/PACK) ×4 IMPLANT
TUBE CONNECTING 20'X1/4 (TUBING) ×2
TUBE CONNECTING 20X1/4 (TUBING) ×6 IMPLANT
TUNNELER SHEATH ON-Q 11GX8 DSP (PAIN MANAGEMENT) ×4 IMPLANT
WATER STERILE IRR 1000ML POUR (IV SOLUTION) ×8 IMPLANT

## 2016-11-04 NOTE — Op Note (Signed)
NAME:  Catherine Matthews, Catherine Matthews NO.:  [id number]  MEDICAL RECORD NO.:  [id number]  LOCATION:  [facility]                         FACILITY:  [facility]  PHYSICIAN:  Ivin Poot, M.D.  DATE OF BIRTH:  [birth year]  DATE OF PROCEDURE: DATE OF DISCHARGE:                              OPERATIVE REPORT   OPERATION: 1. Fiberoptic bronchoscopy. 2. Right VATS (video-assisted thoracoscopic surgery) with right mini-     thoracotomy and right lower lobectomy. 3. Nodal lymph node dissection.  SURGEON:  Ivin Poot, M.D.  ASSISTANT:  John Giovanni, PA-C.  ANESTHESIA:  General by Dr. Lillia Abed.  PREOPERATIVE DIAGNOSIS:  A 3 cm right lower lobe density suspicious for primary bronchogenic carcinoma.  POSTOPERATIVE DIAGNOSIS:  A 3 cm right lower lobe density suspicious for primary bronchogenic carcinoma.  CLINICAL NOTE:  The patient is a 61 year old Caucasian female, reformed smoker, who presents with recently diagnosed right lower lobe nodule, evaluated by Dr. Verdie Mosher, who had positive activity on PET scan with an SUV of 4.2.  There was no other evidence of mediastinal or distant metastatic disease.  Brain MRI was negative for metastatic disease. PFTs were excellent and supported lobectomy for treatment of cancer in this patient.  The patient was examined in the office and all the studies were reviewed and counseled with the patient.  The indications and expected benefits of right VATS and right lower lobectomy for treatment of the right lower lobe density were discussed in detail.  I discussed the operation itself including the use of general anesthesia, location of the surgical incisions, and the expected postoperative hospital recovery.  I also reviewed the potential unexpected risks to her with surgery including risks of bleeding, blood transfusion, postoperative air leak, postoperative pneumonia, wound infection, and death.  After reviewing these issues, she demonstrated her  understanding and agreed to proceed with surgery and what I felt was an informed consent.  OPERATIVE PROCEDURE:  The patient was brought from the preop holding area were the patient had been marked on the proper site and informed consent was again confirmed.  She was placed supine on the operating room table.  General anesthesia was induced.  A double-lumen endotracheal tube was placed.  Through the double-lumen tube, I placed a fiberoptic scope.  The distal trachea and carina were normal.  The bronchial end of the double-lumen tube went down the left mainstem bronchus and fit nicely.  The right mainstem bronchus had no endobronchial lesions.  The endobronchial segments of the right upper lobe, right middle lobe, and right lower lobe were all examined and there was no evidence of any endobronchial lesions.  The bronchoscope was removed.  The patient was then turned right side up and the right chest was prepped and draped as a sterile field.  A proper time-out was performed.  A small incision was made below the tip of the scapula and the trocar was inserted into the pleural space.  The VATS camera was inserted.  The lung was inspected.  The mass in the right lower lobe was evident with some visceral pleural changes and retraction as well as an abnormal looking mass.  There was no adhesion of the lung to  the chest wall.  A small incision was made approximately 6 cm and the retractor gently was used to spread the ribs.  The inferior ligament was taken down.  The lung was not optimally decompressed and further intervention by the anesthesiologist improved the collapse of the right lung.  The mass was palpable in the posterior segment of the right lower lobe.  It was large.  I recommended and I felt that lobectomy would be the best initial therapy because a biopsy would be only partially diagnostic and to remove the entire mass would require lobectomy.  The pulmonary artery branches  and the major fissure were then identified after dissecting out the fissure.  This included the main pulmonary artery, the posterior ascending branch to the upper lobe, the branches to the middle lobe, the branches to the superior segment of the right lower lobe, and the branches to the basilar segments of the right lower lobe.  Using the Endo GIA stapler, the branch to the superior segment was first stapled and divided.  This provided more room for the main branch of the pulmonary artery to the basilar segments to be stapled and divided.  Next, the inferior pulmonary vein was dissected out and encircled with a vessel loop.  It was clear that the upper superior pulmonary vein supplied venous drainage to the middle lobe.  The inferior pulmonary vein was then stapled and divided.  We then completed the fissures anteriorly and posteriorly in which exposed the bronchus to the lower lobe.  The Endo-GIA stapler was then clamped, but not fired after the right lung was inflated.  On the first initial bronchial clamping and ventilation, there was no ventilation of the right middle lobe, so the clamp was removed.  The staple was then reapplied more distally and clamped and with ventilation the right middle lobe inflated.  The bronchus was then stapled and divided.  There was a good staple line.  The specimen was removed and sent for Pathology.  Frozen section on the bronchial margin was negative.  Frozen section on the primary tumor showed non-small cell carcinoma consistent with adenocarcinoma.  Intrapulmonary nodes around the main PA were also dissected off and sent as separate specimens.  Hemostasis was achieved.  The chest was filled with warm saline and the right lung was ventilated.  There was minimal if any air leak.  The staple lines were coated with a layer of medical adhesive - Coseal.  Anterior and posterior chest tubes were placed and brought out through separate incisions.   The port for the VATS camera was closed in interrupted layers using Vicryl and nylon for the skin.  A #1 pericostal sutures were used to reapproximate the ribs.  The lungs were completely inflated prior to closing the ribs together.  The muscle layer was closed with interrupted #1 Vicryl in two layers.  The subcutaneous and skin layers were closed with running Vicryl.  An On-Q subcutaneous wound analgesia irrigation system was then placed and secured and connected to a reservoir of 0.5% Marcaine.  The chest tubes were connected to the Pleur-evac.  The patient was then turned supine.  Anesthesia was reversed and she was extubated.  The patient returned to recovery room in stable condition.     Ivin Poot, M.D.     PV/MEDQ  D:  11/04/2016  T:  11/04/2016  Job:  [id number]  cc:   Gwenevere Ghazi

## 2016-11-04 NOTE — Progress Notes (Signed)
Patient ID: Catherine Matthews, female   DOB: 04-03-1956, 61 y.o.   MRN: [id number]  SICU Evening Rounds:   Hemodynamically stable  Pain under control  Urine output good. Cloudy so will check UA/culture. CT output low, thin serosanguinous, no air leak.  CBC    Component Value Date/Time   WBC 7.0 11/01/2016 0915   RBC 4.50 11/01/2016 0915   HGB 13.9 11/04/2016 1152   HCT 41.0 11/04/2016 1152   PLT 233 11/01/2016 0915   MCV 90.9 11/01/2016 0915   MCH 29.8 11/01/2016 0915   MCHC 32.8 11/01/2016 0915   RDW 13.5 11/01/2016 0915     BMET    Component Value Date/Time   NA 142 11/04/2016 1152   K 4.2 11/04/2016 1152   CL 110 11/01/2016 0915   CO2 21 (L) 11/01/2016 0915   GLUCOSE 103 (H) 11/01/2016 0915   BUN 12 11/01/2016 0915   CREATININE 0.83 11/01/2016 0915   CALCIUM 9.6 11/01/2016 0915   GFRNONAA >60 11/01/2016 0915   GFRAA >60 11/01/2016 0915     A/P:  Stable postop course. Continue current plans

## 2016-11-04 NOTE — Brief Op Note (Signed)
11/04/2016  11:03 AM  PATIENT:  Catherine Matthews  61 y.o. female  PRE-OPERATIVE DIAGNOSIS:  RLL NODULE  POST-OPERATIVE DIAGNOSIS:  RLL NODULE  PROCEDURE:  Procedure(s): VIDEO BRONCHOSCOPY (N/A) VIDEO ASSISTED THORACOSCOPY (VATS), THOROCOTOMY, RIGHT LOWER LOBECTOMY (Right) LN SAMPLING  SURGEON:  Surgeon(s) and Role:    * Ivin Poot, MD - Primary  PHYSICIAN ASSISTANT: Laurelin Elson PA-C  ANESTHESIA:   general  EBL:  Total I/O In: -  Out: 625 [Urine:575; Blood:50]  BLOOD ADMINISTERED:none  DRAINS: 2 Chest Tube(s) in the RIGHT HEMITHORAX   LOCAL MEDICATIONS USED:  MARCAINE     SPECIMEN:  Source of Specimen:  RLL, LN SAMPLES  DISPOSITION OF SPECIMEN:  PATHOLOGY  COUNTS:  YES  TOURNIQUET:  * No tourniquets in log *  DICTATION: .Other Dictation: Dictation Number PENDING  PLAN OF CARE: Admit to inpatient   PATIENT DISPOSITION:  PACU - hemodynamically stable.   Delay start of Pharmacological VTE agent (>24hrs) due to surgical blood loss or risk of bleeding: yes  FROZEN: NON SMALL CELL CA- FAVOR ADENO, NEG MARGIN  COMPLICATION: NO KNOWN

## 2016-11-04 NOTE — Anesthesia Postprocedure Evaluation (Signed)
Anesthesia Post Note  Patient: Catherine Matthews  Procedure(s) Performed: Procedure(s) (LRB): VIDEO BRONCHOSCOPY (N/A) VIDEO ASSISTED THORACOSCOPY (VATS), THOROCOTOMY, RIGHT LOWER LOBECTOMY (Right)  Patient location during evaluation: PACU Anesthesia Type: General Level of consciousness: awake and alert Pain management: pain level controlled Vital Signs Assessment: post-procedure vital signs reviewed and stable Respiratory status: spontaneous breathing, nonlabored ventilation, respiratory function stable and patient connected to nasal cannula oxygen Cardiovascular status: blood pressure returned to baseline and stable Postop Assessment: no signs of nausea or vomiting Anesthetic complications: no       Last Vitals:  Vitals:   11/04/16 1330 11/04/16 1400  BP: 100/71 112/76  Pulse: 86 84  Resp: 15 10  Temp: 36.3 C 36.5 C    Last Pain:  Vitals:   11/04/16 1400  TempSrc: Oral  PainSc:                  [name] DAVID

## 2016-11-04 NOTE — Progress Notes (Signed)
Patient not responsive; O2 sats dropping into 50's and apneic.  Nasal trumpet inserted and bag mask ventilation at 15l started. Dr Conrad [place] notified and en route to bedside. Narcan 0.2 mg IV given and abg obtained per MD.

## 2016-11-04 NOTE — Progress Notes (Signed)
The patient was examined and preop studies reviewed. There has been no change from the prior exam and the patient is ready for surgery.  Plan bronch, Right VATS pulmonary resection

## 2016-11-04 NOTE — OR Nursing (Signed)
[id number] Bronchoscopy performed per Dr. Prescott Gum via double lumen ETT.

## 2016-11-04 NOTE — Anesthesia Procedure Notes (Signed)
Procedure Name: Intubation Date/Time: 11/04/2016 7:48 AM Performed by: Salli Quarry Corabelle Spackman Pre-anesthesia Checklist: Patient identified, Emergency Drugs available, Suction available and Patient being monitored Patient Re-evaluated:Patient Re-evaluated prior to inductionOxygen Delivery Method: Circle System Utilized Preoxygenation: Pre-oxygenation with 100% oxygen Intubation Type: IV induction Ventilation: Mask ventilation without difficulty Laryngoscope Size: Mac and 3 Grade View: Grade I Endobronchial tube: Right and Double lumen EBT and 37 Fr Number of attempts: 1 Airway Equipment and Method: Stylet and Oral airway Placement Confirmation: ETT inserted through vocal cords under direct vision,  positive ETCO2 and breath sounds checked- equal and bilateral Secured at: 29 cm Tube secured with: Tape Dental Injury: Teeth and Oropharynx as per pre-operative assessment

## 2016-11-04 NOTE — Progress Notes (Signed)
Patient c/o pain upon arrival from OR. Pain med given per CRNA.

## 2016-11-04 NOTE — Plan of Care (Signed)
Problem: Activity: Goal: Risk for activity intolerance will decrease Outcome: Progressing Pt was able to dangle at bedside postoperatively without complications  Problem: Pain Management: Goal: Pain level will decrease Outcome: Progressing Pts pain is controlled well by Fentanyl PCA

## 2016-11-04 NOTE — Anesthesia Preprocedure Evaluation (Signed)
Anesthesia Evaluation  Patient identified by MRN, date of birth, ID band Patient awake    Reviewed: Allergy & Precautions, NPO status , Patient's Chart, lab work & pertinent test results  Airway Mallampati: I  TM Distance: >3 FB Neck ROM: Full    Dental   Pulmonary former smoker,    Pulmonary exam normal        Cardiovascular + CAD  Normal cardiovascular exam     Neuro/Psych Anxiety    GI/Hepatic GERD  Medicated and Controlled,  Endo/Other    Renal/GU      Musculoskeletal   Abdominal   Peds  Hematology   Anesthesia Other Findings   Reproductive/Obstetrics                             Anesthesia Physical Anesthesia Plan  ASA: III  Anesthesia Plan: General   Post-op Pain Management:    Induction: Intravenous  Airway Management Planned: Double Lumen EBT  Additional Equipment: Arterial line, CVP and Ultrasound Guidance Line Placement  Intra-op Plan:   Post-operative Plan: Extubation in OR  Informed Consent: I have reviewed the patients History and Physical, chart, labs and discussed the procedure including the risks, benefits and alternatives for the proposed anesthesia with the patient or authorized representative who has indicated his/her understanding and acceptance.     Plan Discussed with: CRNA and Surgeon  Anesthesia Plan Comments:         Anesthesia Quick Evaluation

## 2016-11-04 NOTE — Anesthesia Procedure Notes (Signed)
Central Venous Catheter Insertion Performed by: Suzette Battiest, anesthesiologist Start/End4/23/2018 7:15 AM, 11/04/2016 7:25 AM Patient location: Pre-op. Preanesthetic checklist: patient identified, IV checked, site marked, risks and benefits discussed, surgical consent, monitors and equipment checked, pre-op evaluation, timeout performed and anesthesia consent Position: Trendelenburg Lidocaine 1% used for infiltration and patient sedated Hand hygiene performed , maximum sterile barriers used  and Seldinger technique used Catheter size: 8 Fr Total catheter length 16. Central line was placed.Double lumen Procedure performed using ultrasound guided technique. Ultrasound Notes:anatomy identified, needle tip was noted to be adjacent to the nerve/plexus identified, no ultrasound evidence of intravascular and/or intraneural injection and image(s) printed for medical record Attempts: 1 Following insertion, dressing applied, line sutured and Biopatch. Post procedure assessment: blood return through all ports  Patient tolerated the procedure well with no immediate complications.

## 2016-11-04 NOTE — Transfer of Care (Signed)
Immediate Anesthesia Transfer of Care Note  Patient: Catherine Matthews  Procedure(s) Performed: Procedure(s): VIDEO BRONCHOSCOPY (N/A) VIDEO ASSISTED THORACOSCOPY (VATS), THOROCOTOMY, RIGHT LOWER LOBECTOMY (Right)  Patient Location: PACU  Anesthesia Type:General  Level of Consciousness: awake, alert , oriented and patient cooperative  Airway & Oxygen Therapy: Patient Spontanous Breathing and Patient connected to nasal cannula oxygen  Post-op Assessment: Report given to RN and Post -op Vital signs reviewed and stable  Post vital signs: Reviewed and stable  Last Vitals:  Vitals:   11/04/16 0648  Temp: 36.4 C    Last Pain:  Vitals:   11/04/16 0648  TempSrc: Oral      Patients Stated Pain Goal: 2 (04/88/89 1694)  Complications: No apparent anesthesia complications

## 2016-11-05 ENCOUNTER — Inpatient Hospital Stay (HOSPITAL_COMMUNITY): Payer: 59

## 2016-11-05 ENCOUNTER — Encounter (HOSPITAL_COMMUNITY): Payer: Self-pay | Admitting: Cardiothoracic Surgery

## 2016-11-05 LAB — BLOOD GAS, ARTERIAL
Acid-base deficit: 1.7 mmol/L (ref 0.0–2.0)
Bicarbonate: 23.4 mmol/L (ref 20.0–28.0)
Drawn by: 39898
O2 CONTENT: 2 L/min
O2 SAT: 96.7 %
PCO2 ART: 45.7 mmHg (ref 32.0–48.0)
PH ART: 7.33 — AB (ref 7.350–7.450)
Patient temperature: 98.6
pO2, Arterial: 86.3 mmHg (ref 83.0–108.0)

## 2016-11-05 LAB — BASIC METABOLIC PANEL
ANION GAP: 5 (ref 5–15)
BUN: 8 mg/dL (ref 6–20)
CALCIUM: 7.8 mg/dL — AB (ref 8.9–10.3)
CO2: 24 mmol/L (ref 22–32)
Chloride: 109 mmol/L (ref 101–111)
Creatinine, Ser: 0.78 mg/dL (ref 0.44–1.00)
GFR calc non Af Amer: >60 mL/min (ref >60)
Glucose, Bld: 131 mg/dL — ABNORMAL HIGH (ref 65–99)
Potassium: 3.8 mmol/L (ref 3.5–5.1)
Sodium: 138 mmol/L (ref 135–145)

## 2016-11-05 LAB — CBC
HEMATOCRIT: 37.2 % (ref 36.0–46.0)
HEMOGLOBIN: 11.8 g/dL — AB (ref 12.0–15.0)
MCH: 29.3 pg (ref 26.0–34.0)
MCHC: 31.7 g/dL (ref 30.0–36.0)
MCV: 92.3 fL (ref 78.0–100.0)
Platelets: 209 10*3/uL (ref 150–400)
RBC: 4.03 MIL/uL (ref 3.87–5.11)
RDW: 13.7 % (ref 11.5–15.5)
WBC: 9.6 10*3/uL (ref 4.0–10.5)

## 2016-11-05 MED ORDER — WHITE PETROLATUM GEL
Status: AC
  Filled 2016-11-05: qty 1

## 2016-11-05 NOTE — Progress Notes (Signed)
1 Day Post-Op Procedure(s) (LRB): VIDEO BRONCHOSCOPY (N/A) VIDEO ASSISTED THORACOSCOPY (VATS), THOROCOTOMY, RIGHT LOWER LOBECTOMY (Right) Subjective: Walking in hall  Mild air leak CXR w/o pneumothorax Pain controlled Objective: Vital signs in last 24 hours: Temp:  [97.3 F (36.3 C)-98.3 F (36.8 C)] 98 F (36.7 C) (04/24 0409) Pulse Rate:  [65-90] 70 (04/24 0500) Cardiac Rhythm: Normal sinus rhythm (04/23 2000) Resp:  [5-25] 12 (04/24 0500) BP: (72-130)/(49-78) 95/72 (04/24 0500) SpO2:  [91 %-99 %] 95 % (04/24 0500) Arterial Line BP: (129-143)/(67-77) 129/67 (04/23 1330)  Hemodynamic parameters for last 24 hours:  nsr  Intake/Output from previous day: 04/23 0701 - 04/24 0700 In: 3387.5 [I.V.:3387.5] Out: 4445 [Urine:1205; Blood:50; Chest Tube:520] Intake/Output this shift: No intake/output data recorded.       Exam    General- alert and comfortable   Lungs- clear without rales, wheezes   Cor- regular rate and rhythm, no murmur , gallop   Abdomen- soft, non-tender   Extremities - warm, non-tender, minimal edema   Neuro- oriented, appropriate, no focal weakness   Lab Results:  Recent Labs  11/04/16 1152 11/05/16 0442  WBC  --  9.6  HGB 13.9 11.8*  HCT 41.0 37.2  PLT  --  209   BMET:  Recent Labs  11/04/16 1152 11/05/16 0442  NA 142 138  K 4.2 3.8  CL  --  109  CO2  --  24  GLUCOSE  --  131*  BUN  --  8  CREATININE  --  0.78  CALCIUM  --  7.8*    PT/INR: No results for input(s): LABPROT, INR in the last 72 hours. ABG    Component Value Date/Time   PHART 7.330 (L) 11/05/2016 0440   HCO3 23.4 11/05/2016 0440   TCO2 26 11/04/2016 1152   ACIDBASEDEF 1.7 11/05/2016 0440   O2SAT 96.7 11/05/2016 0440   CBG (last 3)  No results for input(s): GLUCAP in the last 72 hours.  Assessment/Plan: S/P Procedure(s) (LRB): VIDEO BRONCHOSCOPY (N/A) VIDEO ASSISTED THORACOSCOPY (VATS), THOROCOTOMY, RIGHT LOWER LOBECTOMY (Right) Mobilize Diuresis chest  tubes to suction   LOS: 1 day    Tharon Aquas Trigt III 11/05/2016

## 2016-11-05 NOTE — Progress Notes (Signed)
Patient ID: Catherine Matthews, female   DOB: 11/24/1955, 61 y.o.   MRN: [id number] EVENING ROUNDS NOTE :     [address]       [place] [address]             [phone]                 1 Day Post-Op Procedure(s) (LRB): VIDEO BRONCHOSCOPY (N/A) VIDEO ASSISTED THORACOSCOPY (VATS), THOROCOTOMY, RIGHT LOWER LOBECTOMY (Right)  Total Length of Stay:  LOS: 1 day  BP 104/65   Pulse 79   Temp 99.1 F (37.3 C) (Oral)   Resp 14   SpO2 99%   .Intake/Output      04/23 0701 - 04/24 0700 04/24 0701 - 04/25 0700   P.O.  360   I.V. 3387.5 80   Total Intake 3387.5 440   Urine 1205 1100   Blood 50    Chest Tube 520 310   Total Output 1775 1410   Net +1612.5 -970          . dextrose 5 % and 0.9% NaCl 10 mL (11/05/16 0808)  . potassium chloride (KCL MULTIRUN) 30 mEq in 265 mL IVPB       Lab Results  Component Value Date   WBC 9.6 11/05/2016   HGB 11.8 (L) 11/05/2016   HCT 37.2 11/05/2016   PLT 209 11/05/2016   GLUCOSE 131 (H) 11/05/2016   ALT 25 11/01/2016   AST 27 11/01/2016   NA 138 11/05/2016   K 3.8 11/05/2016   CL 109 11/05/2016   CREATININE 0.78 11/05/2016   BUN 8 11/05/2016   CO2 24 11/05/2016   INR 0.88 11/01/2016   Dg Chest Port 1 View  Result Date: 11/05/2016 CLINICAL DATA:  Chest soreness after surgery EXAM: PORTABLE CHEST 1 VIEW COMPARISON:  Yesterday FINDINGS: Right-sided chest tube in unchanged position. Right IJ central line with tip at the upper right atrium. Interstitial coarsening which could be atelectasis or mild edema. No effusion or visible pneumothorax. Postoperative volume loss and mediastinal distortion on the right. IMPRESSION: 1. Congestive or atelectatic interstitial coarsening. 2. No visible pneumothorax. Electronically Signed   By: Monte Fantasia M.D.   On: 11/05/2016 07:32   No air leak from ct  Grace Isaac MD  Beeper [phone] Office [phone] 11/05/2016 6:42 PM

## 2016-11-05 NOTE — Care Management Note (Signed)
Case Management Note Marvetta Gibbons RN, BSN Unit 2W-Case Manager [phone]  Patient Details  Name: Catherine Matthews MRN: [id number] Date of Birth: [birth year]  Subjective/Objective:   Pt admitted s/p VATS on 11/04/16                 Action/Plan: PTA pt lived at home- independent- anticipate return home- PCP- MCFADDEN, JOHN C.  CM to follow for d/c needs.   Expected Discharge Date:                  Expected Discharge Plan:  Home/Self Care  In-House Referral:     Discharge planning Services  CM Consult  Post Acute Care Choice:    Choice offered to:     DME Arranged:    DME Agency:     HH Arranged:    HH Agency:     Status of Service:  In process, will continue to follow  If discussed at Long Length of Stay Meetings, dates discussed:    Discharge Disposition:   Additional Comments:  Dawayne Patricia, RN 11/05/2016, 12:41 PM

## 2016-11-05 NOTE — Plan of Care (Signed)
Problem: Activity: Goal: Risk for activity intolerance will decrease Outcome: Progressing Pt tolerated ambulation around the unit twice.. (769f)  Problem: Respiratory: Goal: Pain level will decrease with appropriate interventions Outcome: Progressing On full dose Fentany PCA

## 2016-11-06 ENCOUNTER — Inpatient Hospital Stay (HOSPITAL_COMMUNITY): Payer: 59

## 2016-11-06 LAB — CBC
HEMATOCRIT: 36.4 % (ref 36.0–46.0)
Hemoglobin: 11.6 g/dL — ABNORMAL LOW (ref 12.0–15.0)
MCH: 29.4 pg (ref 26.0–34.0)
MCHC: 31.9 g/dL (ref 30.0–36.0)
MCV: 92.4 fL (ref 78.0–100.0)
Platelets: 133 10*3/uL — ABNORMAL LOW (ref 150–400)
RBC: 3.94 MIL/uL (ref 3.87–5.11)
RDW: 13.7 % (ref 11.5–15.5)
WBC: 9.3 10*3/uL (ref 4.0–10.5)

## 2016-11-06 LAB — TYPE AND SCREEN
ABO/RH(D): A POS
Antibody Screen: NEGATIVE
Unit division: 0
Unit division: 0

## 2016-11-06 LAB — COMPREHENSIVE METABOLIC PANEL
ALBUMIN: 2.9 g/dL — AB (ref 3.5–5.0)
ALT: 23 U/L (ref 14–54)
AST: 28 U/L (ref 15–41)
Alkaline Phosphatase: 32 U/L — ABNORMAL LOW (ref 38–126)
Anion gap: 5 (ref 5–15)
BILIRUBIN TOTAL: 1 mg/dL (ref 0.3–1.2)
BUN: 8 mg/dL (ref 6–20)
CHLORIDE: 110 mmol/L (ref 101–111)
CO2: 24 mmol/L (ref 22–32)
CREATININE: 0.83 mg/dL (ref 0.44–1.00)
Calcium: 8 mg/dL — ABNORMAL LOW (ref 8.9–10.3)
GFR calc Af Amer: >60 mL/min (ref >60)
GFR calc non Af Amer: >60 mL/min (ref >60)
Glucose, Bld: 107 mg/dL — ABNORMAL HIGH (ref 65–99)
POTASSIUM: 3.9 mmol/L (ref 3.5–5.1)
Sodium: 139 mmol/L (ref 135–145)
Total Protein: 5.3 g/dL — ABNORMAL LOW (ref 6.5–8.1)

## 2016-11-06 LAB — BPAM RBC
Blood Product Expiration Date: 201805102359
Blood Product Expiration Date: 201805102359
ISSUE DATE / TIME: 201804230709
ISSUE DATE / TIME: 201804230709
Unit Type and Rh: 6200
Unit Type and Rh: 6200

## 2016-11-06 MED ORDER — ENOXAPARIN SODIUM 30 MG/0.3ML ~~LOC~~ SOLN
30.0000 mg | SUBCUTANEOUS | Status: DC
  Administered 2016-11-06 – 2016-11-07 (×2): 30 mg via SUBCUTANEOUS
  Filled 2016-11-06 (×2): qty 0.3

## 2016-11-06 MED ORDER — FUROSEMIDE 10 MG/ML IJ SOLN
20.0000 mg | Freq: Once | INTRAMUSCULAR | Status: AC
  Administered 2016-11-06: 20 mg via INTRAVENOUS
  Filled 2016-11-06: qty 2

## 2016-11-06 MED ORDER — POTASSIUM CHLORIDE CRYS ER 20 MEQ PO TBCR
20.0000 meq | EXTENDED_RELEASE_TABLET | Freq: Every day | ORAL | Status: AC
  Administered 2016-11-06 – 2016-11-07 (×2): 20 meq via ORAL
  Filled 2016-11-06 (×2): qty 1

## 2016-11-06 NOTE — Progress Notes (Signed)
11/06/2016 1750 Received pt to room 2W23 from [facility].  Pt is A&O, some C/O pain, did receive pain med before transfer, will monitor.  Tele monitor applied and CCMD notified.  Oriented to room, call light and bed.  Call bell in reach, family at bedside. [place]

## 2016-11-06 NOTE — Progress Notes (Signed)
11/06/2016 1300 Telephone verbal order Dr. Prescott Gum leave both CT in at this time. Ok to still transfer to 2 west circle bed. Pt. Updated on plan of care.  Giavonni Fonder, Arville Lime

## 2016-11-06 NOTE — Progress Notes (Signed)
11/06/2016  0940 PCA d/c per orders. Pt. Updated on plan of care and pain management options. 25 ml Fentanyl wasted in sink. Witnessed by Beckie Salts RN. [place] Gadbois, Arville Lime

## 2016-11-06 NOTE — Progress Notes (Signed)
2 Days Post-Op Procedure(s) (LRB): VIDEO BRONCHOSCOPY (N/A) VIDEO ASSISTED THORACOSCOPY (VATS), THOROCOTOMY, RIGHT LOWER LOBECTOMY (Right) Subjective: Feels better No air leak Will transfer to 2w stepdown Chest tubes to water seal  Objective: Vital signs in last 24 hours: Temp:  [98.2 F (36.8 C)-99.1 F (37.3 C)] 98.4 F (36.9 C) (04/25 0445) Pulse Rate:  [72-95] 88 (04/25 0745) Cardiac Rhythm: Normal sinus rhythm (04/25 0730) Resp:  [10-25] 20 (04/25 0758) BP: (91-131)/(61-91) 120/85 (04/25 0745) SpO2:  [91 %-99 %] 96 % (04/25 0758)  Hemodynamic parameters for last 24 hours:  stable  Intake/Output from previous day: 04/24 0701 - 04/25 0700 In: 588.3 [P.O.:360; I.V.:228.3] Out: 2365 [Urine:1875; Chest Tube:490] Intake/Output this shift: No intake/output data recorded.       Exam    General- alert and comfortable   Lungs- clear without rales, wheezes   Cor- regular rate and rhythm, no murmur , gallop   Abdomen- soft, non-tender   Extremities - warm, non-tender, minimal edema   Neuro- oriented, appropriate, no focal weakness   Lab Results:  Recent Labs  11/05/16 0442 11/06/16 0500  WBC 9.6 9.3  HGB 11.8* 11.6*  HCT 37.2 36.4  PLT 209 133*   BMET:  Recent Labs  11/05/16 0442 11/06/16 0500  NA 138 139  K 3.8 3.9  CL 109 110  CO2 24 24  GLUCOSE 131* 107*  BUN 8 8  CREATININE 0.78 0.83  CALCIUM 7.8* 8.0*    PT/INR: No results for input(s): LABPROT, INR in the last 72 hours. ABG    Component Value Date/Time   PHART 7.330 (L) 11/05/2016 0440   HCO3 23.4 11/05/2016 0440   TCO2 26 11/04/2016 1152   ACIDBASEDEF 1.7 11/05/2016 0440   O2SAT 96.7 11/05/2016 0440   CBG (last 3)  No results for input(s): GLUCAP in the last 72 hours.  Assessment/Plan: S/P Procedure(s) (LRB): VIDEO BRONCHOSCOPY (N/A) VIDEO ASSISTED THORACOSCOPY (VATS), THOROCOTOMY, RIGHT LOWER LOBECTOMY (Right) Diuresis d/c tubes/lines Plan for transfer to step-down: see transfer  orders   LOS: 2 days    Tharon Aquas Trigt III 11/06/2016

## 2016-11-06 NOTE — Progress Notes (Signed)
11/06/2016 0900 ON-Q pain pump d/c per order. End intact. Pt. Tolerated well. Container empty and wasted in sharps.  Dorothea Yow, Arville Lime

## 2016-11-07 ENCOUNTER — Inpatient Hospital Stay (HOSPITAL_COMMUNITY): Payer: 59

## 2016-11-07 ENCOUNTER — Encounter (HOSPITAL_COMMUNITY): Payer: Self-pay | Admitting: General Practice

## 2016-11-07 LAB — CBC
HCT: 38.4 % (ref 36.0–46.0)
Hemoglobin: 12.4 g/dL (ref 12.0–15.0)
MCH: 29.5 pg (ref 26.0–34.0)
MCHC: 32.3 g/dL (ref 30.0–36.0)
MCV: 91.2 fL (ref 78.0–100.0)
Platelets: 220 10*3/uL (ref 150–400)
RBC: 4.21 MIL/uL (ref 3.87–5.11)
RDW: 13.7 % (ref 11.5–15.5)
WBC: 13.4 10*3/uL — ABNORMAL HIGH (ref 4.0–10.5)

## 2016-11-07 LAB — BASIC METABOLIC PANEL
Anion gap: 8 (ref 5–15)
BUN: 10 mg/dL (ref 6–20)
CO2: 26 mmol/L (ref 22–32)
Calcium: 8.7 mg/dL — ABNORMAL LOW (ref 8.9–10.3)
Chloride: 102 mmol/L (ref 101–111)
Creatinine, Ser: 0.82 mg/dL (ref 0.44–1.00)
GFR calc Af Amer: >60 mL/min (ref >60)
GFR calc non Af Amer: >60 mL/min (ref >60)
Glucose, Bld: 109 mg/dL — ABNORMAL HIGH (ref 65–99)
Potassium: 4 mmol/L (ref 3.5–5.1)
Sodium: 136 mmol/L (ref 135–145)

## 2016-11-07 MED ORDER — NAPROXEN 250 MG PO TABS
500.0000 mg | ORAL_TABLET | Freq: Two times a day (BID) | ORAL | Status: DC
  Administered 2016-11-07 – 2016-11-08 (×2): 500 mg via ORAL
  Filled 2016-11-07 (×2): qty 2

## 2016-11-07 NOTE — Progress Notes (Addendum)
[address]       [place] [address]             [phone]      3 Days Post-Op Procedure(s) (LRB): VIDEO BRONCHOSCOPY (N/A) VIDEO ASSISTED THORACOSCOPY (VATS), THOROCOTOMY, RIGHT LOWER LOBECTOMY (Right) Subjective: Feels well, no specific c/o  Objective: Vital signs in last 24 hours: Temp:  [98 F (36.7 C)-99.1 F (37.3 C)] 98.3 F (36.8 C) (04/26 0413) Pulse Rate:  [88-122] 104 (04/26 0413) Cardiac Rhythm: Sinus tachycardia (04/25 1900) Resp:  [15-27] 18 (04/26 0413) BP: (103-148)/(66-105) 121/66 (04/26 0413) SpO2:  [85 %-99 %] 94 % (04/26 0413)  Hemodynamic parameters for last 24 hours:    Intake/Output from previous day: 04/25 0701 - 04/26 0700 In: 10 [I.V.:10] Out: 2045 [Urine:1825; Chest Tube:220] Intake/Output this shift: No intake/output data recorded.  General appearance: alert, cooperative and no distress Heart: regular rate and rhythm Lungs: dim in bases- mildly Abdomen: benign Extremities: no edema or calf tenderness Wound: incis healing well  Lab Results:  Recent Labs  11/06/16 0500 11/07/16 0420  WBC 9.3 13.4*  HGB 11.6* 12.4  HCT 36.4 38.4  PLT 133* 220   BMET:  Recent Labs  11/06/16 0500 11/07/16 0420  NA 139 136  K 3.9 4.0  CL 110 102  CO2 24 26  GLUCOSE 107* 109*  BUN 8 10  CREATININE 0.83 0.82  CALCIUM 8.0* 8.7*    PT/INR: No results for input(s): LABPROT, INR in the last 72 hours. ABG    Component Value Date/Time   PHART 7.330 (L) 11/05/2016 0440   HCO3 23.4 11/05/2016 0440   TCO2 26 11/04/2016 1152   ACIDBASEDEF 1.7 11/05/2016 0440   O2SAT 96.7 11/05/2016 0440   CBG (last 3)  No results for input(s): GLUCAP in the last 72 hours.  Meds Scheduled Meds: . acetaminophen  1,000 mg Oral Q6H   Or  . acetaminophen (TYLENOL) oral liquid 160 mg/5 mL  1,000 mg Oral Q6H  . bisacodyl  10 mg Oral Daily  . enoxaparin (LOVENOX) injection  30 mg Subcutaneous Q24H  . mupirocin ointment  1 application  Nasal BID  . pantoprazole  40 mg Oral Daily  . pneumococcal 23 valent vaccine  0.5 mL Intramuscular Tomorrow-1000  . potassium chloride  20 mEq Oral Daily  . senna-docusate  1 tablet Oral QHS  . simethicone  80 mg Oral QID   Continuous Infusions: . dextrose 5 % and 0.9% NaCl 10 mL (11/05/16 0808)  . potassium chloride (KCL MULTIRUN) 30 mEq in 265 mL IVPB     PRN Meds:.albuterol, ondansetron (ZOFRAN) IV, oxyCODONE, potassium chloride (KCL MULTIRUN) 30 mEq in 265 mL IVPB, traMADol  Xrays Dg Chest Port 1 View  Result Date: 11/06/2016 CLINICAL DATA:  Postop day 2 right lower lobectomy. Chest tubes in place. Follow-up vascular congestion. EXAM: PORTABLE CHEST 1 VIEW COMPARISON:  11/04/2016, 11/01/2016 and earlier, including PET-CT 10/17/2016 and chest CT 10/10/2016. FINDINGS: Two right chest tubes in place with no pneumothorax. Right jugular central venous catheter tip projects at or just below the cavoatrial junction, unchanged. Improved pulmonary venous hypertension and mild interstitial edema since yesterday. Stable small left pleural effusion. Stable mild bibasilar atelectasis. No new pulmonary parenchymal abnormalities. IMPRESSION: 1.  Support apparatus satisfactory. 2. No pneumothorax. 3. Improved pulmonary venous hypertension and mild interstitial edema since yesterday, with only mild pulmonary venous hypertension persisting. 4. Stable small left pleural effusion and stable mild bibasilar atelectasis. 5. No new  abnormalities. Electronically Signed   By: Evangeline Dakin M.D.   On: 11/06/2016 07:37    Assessment/Plan: S/P Procedure(s) (LRB): VIDEO BRONCHOSCOPY (N/A) VIDEO ASSISTED THORACOSCOPY (VATS), THOROCOTOMY, RIGHT LOWER LOBECTOMY (Right) 1 doing well 2 CT with no air leak, no pneumtx- will d/c tube today 3 labs stable 4 sinus tachy at times - monitor, o/w hemodyn stable 5 push rehab and pulm toilet 6 poss home in am   LOS: 3 days    GOLD,WAYNE E 11/07/2016   path  pending CXR ok DC anterior tube tody, posterior tube tomorrow if   drainage < 200 cc Prepare for possible DC fri afternoon patient examined and medical record reviewed,agree with above note. Tharon Aquas Trigt III 11/07/2016

## 2016-11-07 NOTE — Discharge Summary (Signed)
Physician Discharge Summary  Patient ID: Catherine Matthews MRN: [id number] DOB/AGE: [birth year] 61 y.o.  Admit date: 11/04/2016 Discharge date: 11/08/2016  Admission Diagnoses: Patient Active Problem List   Diagnosis Date Noted  . Chest pain 10/14/2016  . Right lower lobe lung mass 10/10/2016    Discharge Diagnoses:  Active Problems:   S/P lobectomy of lung   Discharged Condition: good  HPI:  Very nice active 61 year old Caucasian female presents for evaluation and therapy recently diagnosed 3 cm right lower lobe density with concern for primary lung cancer. The patient is a reformed smoker, stopped in 2008. At that time she was diagnosed with coronary artery disease and angina and underwent PCI of her LAD. She stopped smoking at that time.  Last month she presented to the [facility] for evaluation of anterior and posterior chest pain with radiation to her neck and jaw relieved by nitroglycerin. She was admitted for chest pain and to cycle cardiac enzymes all of which were negative. Her chest x-ray showed a abnormality in the right lung and subsequent CT scan of the chest was performed which showed the 3 cm density in the right lower lobe. She was discharged to be followed up by her cardiologist and by her pulmonologist Dr. Gwenevere Matthews.  On approximately April 2 she underwent coronary angiography by Dr. Darral Matthews at [facility] which showed a widely patent LAD stent normal coronaries and normal LV function according to the patient. Records will be obtained.  She is evaluated by Dr. Gwenevere Matthews with PET scan which showed the right lower lobe density to have an SUV of 4.3 SUV. There is no evidence of other pulmonary or mediastinal nodal or distant foci of hypermetabolic activity. Brain MRI report is negative for metastatic disease. Morphologically the lesion is suspicious for a slow-growing adenocarcinoma. The patient presents today to discuss surgical resection.  She is not interested and a needle biopsy to establish the diagnosis first but would rather have a resection to definitively treat the abnormality.  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 She is on inhalers. She denies any active problems of shortness of breath productive cough hemoptysis or weight loss fever. There is no family history of lung cancer.  Patient denies previous history of pneumothorax, rib fracture, or aspiration. She works as a Optometrist. She enjoys and is active in her yard work. She also enjoys motorcycle rides.  2012 the patient had a stent placed in the left iliac artery. Within the past 3 months the patient had right bunion surgery and has recovered well.  Hospital Course:  On 11/04/2016 Catherine Matthews underwent a arthroscopic bronchoscopy, right video-assisted thoracoscopic surgery with right mini thoracotomy and right lower lobectomy by Dr. Prescott Matthews. She tolerated the procedure well and was transferred to the ICU. She was extubated in a timely manner and was walking in the hall postop day 1. She did have a mild air leak on her chest tube. We initiated diuretic regimen for fluid overload. We continued her chest tubes to suction. On postop day 2 she continued to progress. We continued her chest tube to waterseal and discontinued lines. We were able to transfer her stepdown unit at this time. Postop day 3 we were able to discontinue her chest tube due to no air leak or no pneumothorax. She remained sinus tachycardic at times. We continued to push ambulation and pulmonary toilet. Today, she is ambulating with limited assistance, she is tolerating room air, her incision  is healing well, and she is ready for discharge home.   Consults: None  Significant Diagnostic Studies: CLINICAL DATA:  Chest tube.  EXAM: PORTABLE CHEST 1 VIEW  COMPARISON:  11/06/2016, 11/05/2016, 11/04/2016.  PET-CT 10/17/2016.  FINDINGS: Postsurgical changes right lung.  Interim removal of right IJ line. Right chest tubes in stable position. Tiny right apical pneumothorax noted. Low lung volumes with basilar atelectasis. Bibasilar infiltrates cannot be excluded. Heart size normal. Mild right chest wall subcutaneous emphysema.  IMPRESSION: 1. Postsurgical changes right lung. Right chest tubes in stable position. Tiny right pneumothorax noted.  2. Persistent bibasilar atelectasis. Bibasilar infiltrates cannot be excluded .  Critical Value/emergent results were called by telephone at the time of interpretation on 11/07/2016 at 7:26 am to nurse Catherine Matthews , who verbally acknowledged these results.   Electronically Signed   By: Catherine Matthews  Register   On: 11/07/2016 07:31  Treatments:   NAME:  Catherine Matthews             ACCOUNT NO.:  [id number]  MEDICAL RECORD NO.:  [id number]  LOCATION:  [facility]                         FACILITY:  [facility]  PHYSICIAN:  Catherine Matthews, M.D.  DATE OF BIRTH:  [birth year]  DATE OF PROCEDURE: DATE OF DISCHARGE:                              OPERATIVE REPORT   OPERATION: 1. Fiberoptic bronchoscopy. 2. Right VATS (video-assisted thoracoscopic surgery) with right mini-     thoracotomy and right lower lobectomy. 3. Nodal lymph node dissection.  SURGEON:  Catherine Matthews, M.D.  ASSISTANT:  Catherine Giovanni, PA-C.  ANESTHESIA:  General by Dr. Lillia Matthews.  PREOPERATIVE DIAGNOSIS:  A 3 cm right lower lobe density suspicious for primary bronchogenic carcinoma.  POSTOPERATIVE DIAGNOSIS:  A 3 cm right lower lobe density suspicious for primary bronchogenic carcinoma.  Discharge Exam: Blood pressure 129/72, pulse 86, temperature 98.8 F (37.1 C), temperature source Oral, resp. rate 18, SpO2 97 %.  General appearance: alert, cooperative and no distress Heart: regular rate and rhythm Lungs: clear to auscultation bilaterally Abdomen: benign Extremities: no edema or calf tenderness Wound: incis healing  well  Disposition: Final discharge disposition not confirmed FINAL DIAGNOSIS Diagnosis: Pathology 1. 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 - TUMOR FOCALLY INVOLVES THE PLEURA. - MARGINS ARE NEGATIVE. - THREE BENIGN HILAR LYMPH NODES WITH NO TUMOR SEEN (0/3). - SEE ONCOLOGY TEMPLATE. 2. Lymph node, biopsy, N 1 - ONE BENIGN LYMPH NODE WITH NO TUMOR SEEN (0/1). 3. Lymph node, biopsy, N1 #2 - ONE BENIGN LYMPH NODE WITH NO TUMOR SEEN (0/1). Microscopic Comment 1. LUNG Specimen, including laterality: Right lower lung lobe with separate additional lymph nodes. Procedure: Right lower lobectomy with lymph node biopsies. Specimen integrity: Intact with multiple staple lines. Tumor site: Right lower lobe abutting the pleura. Tumor focality: Unifocal. Maximum tumor size (cm): 3.5 cm. Histologic type: Invasive adenocarcinoma, acinar and solid patterns. Grade: Well to moderately differentiated (low to intermediate grade). Margins: Negative. Distance to closest margin (cm): 2 cm (bronchial resection margin). Visceral pleura invasion: Yes, identified. Tumor extension: Tumor focally involves visceral pleura. Treatment effect (if treated with neoadjuvant therapy): Not applicable. Lymph -Vascular invasion: Not identified. Lymph nodes: Number examined - 5 ;  Number N1 nodes positive 0 ; Number N2 nodes positive 0. TNM code: pT2a, pN0. Ancillary Studies: Can be performed upon request. 1 of 3 FINAL for Shark, Yiselle ([id number]) Microscopic Comment(continued) Best tumor block for sendout testing: 1D. Non-neoplastic lung: Scattered anthracotic pigment present. Comments: Dr. Saralyn Pilar has seen a selected slides (1B, 33F, and 1G) in consultation with agreement that focal pleural involvement is present. Of note, the involvement of the pleura is seen on the permanent section of the  frozen section slide (1B). (RAH:ah 11/07/16) Willeen Niece MD Pathologist, Electronic Signature (Case signed 11/07/2016) Intraoperative Diagnosis 1. FROZEN SECTION DIAGNOSIS: RIGHT LOWER LOBE: 1A - NO CARCINOMA IDENTIFIED. 1B - NON-SMALL CELL, FAVOR ADENOCARCINOMA. (DR. Melina Copa) Specimen Gross and Clinical Information Specimen(s) Obtained: 1. Lung, resection (segmental or lobe), Right lower lobe 2. Lymph node, biopsy, N 1 3. Lymph node, biopsy, N1 #2 Specimen Clinical Information 1. RLL Nodule (tl) Gross 1. Specimen: The specimen is received fresh for rapid intraoperative consultation labeled "right lower lobe". Specimen integrity: Intact, with multiple staple lines. Size, weight: 15.2 x 11.1 x 3.7 cm, with a weight of 161 grams. Pleura: Tan-pink to gray-purple with mild anthracosis. On the outer surface a suture designates the area of interest. The pleura overlying the area is inked black. Lesion: There is a 3.5 x 3.2 x 2.0 cm tan-gray, firm, ill-defined lesion identified. The lesion underlies the inked pleural surface. A representative section is submitted for frozen section analysis. Margin(s): The lesion measures approximately 2.0 cm from the bronchial resection margins, which are submitted for frozen section analysis. Hilar vessels: Grossly unremarkable. Nonneoplastic parenchyma: Spongy tan-red with mild anthracosis. Lymph nodes, level 12: Within the hilum two tan-gray possible lymph nodes are identified measuring 0.6 and 1.1 cm in greatest dimension. Block Summary: Ten blocks submitted. A = bronchial margin frozen section remnant B = lesion frozen section remnant C = vascular resection margins D-G = additional sections of lesion H = hilum closest to lesion I = grossly uninvolved parenchyma J = two possible lymph nodes.   Discharge Instructions    Discharge patient    Complete by:  As directed    Discharge disposition:  01-Home or Self Care   Discharge patient date:   11/08/2016     Allergies as of 11/08/2016      Reactions   Lipitor [atorvastatin]    MUSCLE ACHES      Medication List    TAKE these medications   ALPRAZolam 0.5 MG tablet Commonly known as:  XANAX Take 0.5 mg by mouth at bedtime.   ANORO ELLIPTA 62.5-25 MCG/INH Aepb Generic drug:  umeclidinium-vilanterol Inhale 1 puff into the lungs at bedtime.   APPLE CIDER VINEGAR PO Take 2 capsules by mouth every morning.   aspirin EC 81 MG tablet Take 81 mg by mouth at bedtime.   Biotin 5000 MCG Tabs Take 1 tablet by mouth every morning.   EQL COCONUT OIL 1000 MG Caps Generic drug:  Coconut Oil Take 1 capsule by mouth every morning.   gabapentin 300 MG capsule Commonly known as:  NEURONTIN Take 900 mg by mouth at bedtime.   metoprolol succinate 25 MG 24 hr tablet Commonly known as:  TOPROL-XL Take 25 mg by mouth at bedtime.   naproxen 250 MG tablet Commonly known as:  NAPROSYN Take 1 tablet (250 mg total) by mouth 2 (two) times daily as needed.   oxyCODONE 5 MG immediate release tablet Commonly known as:  Oxy IR/ROXICODONE Take 1-2 tablets (5-10 mg total) by  mouth every 6 (six) hours as needed for severe pain.   rosuvastatin 20 MG tablet Commonly known as:  CRESTOR Take 20 mg by mouth at bedtime.   Turmeric 500 MG Caps Take 2 capsules by mouth every morning.      Follow-up Information    MCFADDEN,Catherine C, MD. Call in 1 day(s).   Specialty:  Family Medicine Contact information: [address] [place] [address] [phone]        Tharon Aquas Kerby Less III, MD Follow up.   Specialty:  Cardiothoracic Surgery Why:  Your appointment is on 5/16 at 12:30pm. Please arrive at 12:00pm for a chest xray at [facility] which is located on the first floor of our building.  Contact information: [address] [place] [address] [phone]          A suture removal appointment will also be made with office nurse   Signed: Jadene Pierini  E 11/08/2016, 12:58 PM

## 2016-11-07 NOTE — Discharge Instructions (Signed)
Thoracoscopy, Care After  Refer to this sheet in the next few weeks. These instructions provide you with information about caring for yourself after your procedure. Your health care provider may also give you more specific instructions. Your treatment has been planned according to current medical practices, but problems sometimes occur. Call your health care provider if you have any problems or questions after your procedure. What can I expect after the procedure? After your procedure, it is common to feel sore for up to two weeks. Follow these instructions at home:  There are many different ways to close and cover an incision, including stitches (sutures), skin glue, and adhesive strips. Follow your health care provider's instructions about:  Incision care.  Bandage (dressing) changes and removal.  Incision closure removal.  Check your incision area every day for signs of infection. Watch for:  Redness, swelling, or pain.  Fluid, blood, or pus.  Take medicines only as directed by your health care provider.  Try to cough often. Coughing helps to protect against lung infection (pneumonia). It may hurt to cough. If this happens, hold a pillow against your chest when you cough.  Take deep breaths. This also helps to protect against pneumonia.  If you were given an incentive spirometer, use it as directed by your health care provider.  Do not take baths, swim, or use a hot tub until your health care provider approves. You may take showers.  Avoid lifting until your health care provider approves.  Avoid driving until your health care provider approves.  Do not travel by airplane after the chest tube is removed until your health care provider approves. Contact a health care provider if:  You have a fever.  Pain medicines do not ease your pain.  You have redness, swelling, or increasing pain in your incision area.  You develop a cough that does not go away, or you are coughing up  mucus that is yellow or green. Get help right away if:  You have fluid, blood, or pus coming from your incision.  There is a bad smell coming from your incision or dressing.  You develop a rash.  You have difficulty breathing.  You cough up blood.  You develop light-headedness or you feel faint.  You develop chest pain.  Your heartbeat feels irregular or very fast.  This information is not intended to replace advice given to you by your health care provider. Make sure you discuss any questions you have with your health care provider. Document Released: 01/18/2005 Document Revised: 03/03/2016 Document Reviewed: 03/16/2014 Elsevier Interactive Patient Education  2017 [employer].

## 2016-11-08 ENCOUNTER — Inpatient Hospital Stay (HOSPITAL_COMMUNITY): Payer: 59

## 2016-11-08 MED ORDER — OXYCODONE HCL 5 MG PO TABS: 5.0000 mg | ORAL_TABLET | Freq: Four times a day (QID) | ORAL | 0 refills | Status: DC | PRN

## 2016-11-08 MED ORDER — METOPROLOL TARTRATE 12.5 MG HALF TABLET
12.5000 mg | ORAL_TABLET | Freq: Two times a day (BID) | ORAL | Status: DC
  Administered 2016-11-08: 12.5 mg via ORAL
  Filled 2016-11-08: qty 1

## 2016-11-08 MED ORDER — NAPROXEN 250 MG PO TABS: 250.0000 mg | ORAL_TABLET | Freq: Two times a day (BID) | ORAL | 0 refills | Status: AC | PRN

## 2016-11-08 NOTE — Care Management Note (Signed)
Case Management Note Marvetta Gibbons RN, BSN Unit 2W-Case Manager [phone]  Patient Details  Name: Catherine Matthews MRN: [id number] Date of Birth: 1956-04-28  Subjective/Objective:   Pt admitted s/p VATS on 11/04/16                 Action/Plan: PTA pt lived at home- independent- anticipate return home- PCP- MCFADDEN, JOHN C.  CM to follow for d/c needs.   Expected Discharge Date:  11/08/16               Expected Discharge Plan:  Home/Self Care  In-House Referral:     Discharge planning Services  CM Consult  Post Acute Care Choice:  NA Choice offered to:  NA  DME Arranged:    DME Agency:     HH Arranged:    HH Agency:     Status of Service:  Completed, signed off  If discussed at [facility] of Stay Meetings, dates discussed:    Discharge Disposition: home/self care   Additional Comments:  Dawayne Patricia, RN 11/08/2016, 1:33 PM

## 2016-11-08 NOTE — Progress Notes (Signed)
[address]       [place] [id number]             [phone]      4 Days Post-Op Procedure(s) (LRB): VIDEO BRONCHOSCOPY (N/A) VIDEO ASSISTED THORACOSCOPY (VATS), THOROCOTOMY, RIGHT LOWER LOBECTOMY (Right) Subjective: Feels ok  Objective: Vital signs in last 24 hours: Temp:  [98.3 F (36.8 C)-99.3 F (37.4 C)] 98.8 F (37.1 C) (04/27 0431) Pulse Rate:  [86-102] 86 (04/27 0431) Cardiac Rhythm: Normal sinus rhythm;Sinus tachycardia (04/26 1950) Resp:  [18-19] 18 (04/27 0431) BP: (113-129)/(71-80) 129/72 (04/27 0431) SpO2:  [94 %-98 %] 97 % (04/27 0431)  Hemodynamic parameters for last 24 hours:    Intake/Output from previous day: 04/26 0701 - 04/27 0700 In: 940 [P.O.:840] Out: 650 [Urine:400; Chest Tube:250] Intake/Output this shift: No intake/output data recorded.  General appearance: alert, cooperative and no distress Heart: regular rate and rhythm Lungs: clear to auscultation bilaterally Abdomen: benign Extremities: no edema or calf tenderness Wound: incis healing well  Lab Results:  Recent Labs  11/06/16 0500 11/07/16 0420  WBC 9.3 13.4*  HGB 11.6* 12.4  HCT 36.4 38.4  PLT 133* 220   BMET:  Recent Labs  11/06/16 0500 11/07/16 0420  NA 139 136  K 3.9 4.0  CL 110 102  CO2 24 26  GLUCOSE 107* 109*  BUN 8 10  CREATININE 0.83 0.82  CALCIUM 8.0* 8.7*    PT/INR: No results for input(s): LABPROT, INR in the last 72 hours. ABG    Component Value Date/Time   PHART 7.330 (L) 11/05/2016 0440   HCO3 23.4 11/05/2016 0440   TCO2 26 11/04/2016 1152   ACIDBASEDEF 1.7 11/05/2016 0440   O2SAT 96.7 11/05/2016 0440   CBG (last 3)  No results for input(s): GLUCAP in the last 72 hours.  Meds Scheduled Meds: . acetaminophen  1,000 mg Oral Q6H   Or  . acetaminophen (TYLENOL) oral liquid 160 mg/5 mL  1,000 mg Oral Q6H  . bisacodyl  10 mg Oral Daily  . enoxaparin (LOVENOX) injection  30 mg Subcutaneous Q24H  . mupirocin ointment  1  application Nasal BID  . naproxen  500 mg Oral BID WC  . pantoprazole  40 mg Oral Daily  . pneumococcal 23 valent vaccine  0.5 mL Intramuscular Tomorrow-1000  . senna-docusate  1 tablet Oral QHS  . simethicone  80 mg Oral QID   Continuous Infusions: . dextrose 5 % and 0.9% NaCl 10 mL (11/05/16 0808)  . potassium chloride (KCL MULTIRUN) 30 mEq in 265 mL IVPB     PRN Meds:.albuterol, ondansetron (ZOFRAN) IV, oxyCODONE, potassium chloride (KCL MULTIRUN) 30 mEq in 265 mL IVPB, traMADol  Xrays Dg Chest Port 1 View  Result Date: 11/07/2016 CLINICAL DATA:  Chest tube. EXAM: PORTABLE CHEST 1 VIEW COMPARISON:  11/06/2016, 11/05/2016, 11/04/2016.  PET-CT 10/17/2016. FINDINGS: Postsurgical changes right lung. Interim removal of right IJ line. Right chest tubes in stable position. Tiny right apical pneumothorax noted. Low lung volumes with basilar atelectasis. Bibasilar infiltrates cannot be excluded. Heart size normal. Mild right chest wall subcutaneous emphysema. IMPRESSION: 1. Postsurgical changes right lung. Right chest tubes in stable position. Tiny right pneumothorax noted. 2. Persistent bibasilar atelectasis. Bibasilar infiltrates cannot be excluded . Critical Value/emergent results were called by telephone at the time of interpretation on 11/07/2016 at 7:26 am to nurse Ronalee Belts , who verbally acknowledged these results. Electronically Signed   By: Marcello Moores  Register   On: 11/07/2016 07:31  Assessment/Plan: S/P Procedure(s) (LRB): VIDEO BRONCHOSCOPY (N/A) VIDEO ASSISTED THORACOSCOPY (VATS), THOROCOTOMY, RIGHT LOWER LOBECTOMY (Right)  1 doing well 2 pain a little better with NSAID 3 no air leak- check CXR this am , if ok will d/c tube and probably discharge this afternoon 4 add low dose beta blocker for Tachycardia  5 slight increase in leukocytosis- no fevers, H/H improved- cont to push rehab and pulm toilet   LOS: 4 days    Lauraann Missey E 11/08/2016

## 2016-11-08 NOTE — Progress Notes (Signed)
Discharged to home with family office visits in place teaching done  

## 2016-11-14 ENCOUNTER — Other Ambulatory Visit: Payer: Self-pay | Admitting: *Deleted

## 2016-11-19 ENCOUNTER — Other Ambulatory Visit: Payer: Self-pay | Admitting: Cardiothoracic Surgery

## 2016-11-19 DIAGNOSIS — Z902 Acquired absence of lung [part of]: Secondary | ICD-10-CM

## 2016-11-27 ENCOUNTER — Ambulatory Visit (INDEPENDENT_AMBULATORY_CARE_PROVIDER_SITE_OTHER): Payer: Self-pay | Admitting: Cardiothoracic Surgery

## 2016-11-27 ENCOUNTER — Ambulatory Visit
Admission: RE | Admit: 2016-11-27 | Discharge: 2016-11-27 | Disposition: A | Discharge status: Home / Self Care | Payer: 59 | Source: Ambulatory Visit | Attending: Cardiothoracic Surgery | Admitting: Cardiothoracic Surgery

## 2016-11-27 ENCOUNTER — Encounter: Payer: Self-pay | Admitting: Cardiothoracic Surgery

## 2016-11-27 VITALS — BP 105/69 | HR 76 | Resp 16 | Ht 63.5 in | Wt 125.0 lb

## 2016-11-27 DIAGNOSIS — Z902 Acquired absence of lung [part of]: Secondary | ICD-10-CM

## 2016-11-27 DIAGNOSIS — C3431 Malignant neoplasm of lower lobe, right bronchus or lung: Secondary | ICD-10-CM

## 2016-11-27 NOTE — Progress Notes (Signed)
PCP is Houston Siren., MD Referring Provider is Gwenevere Ghazi, MD  Chief Complaint  Patient presents with  . Routine Post Op    1 month f/u with CXR s/p R VATS, RLLOBECTOMY 11/04/16    EPP:IRJJO postop visit 3 weeks after right VATS right lower lobectomy with mediastinal node dissection for adenocarcinoma. Because the primary tumor dimension was 3.5 cm and there is visceral pleural involvement the patient is clinical stage I-b . The patient was reviewed at our thoracic oncology conference and it was recommended that she speak to a oncologist about possible postoperative chemotherapy. At the time of her hospitalization I asked pathology to send off her tumor for molecular testing-[facility]. I explained the pathologic stage I-b to the patient and provided her with a pathology report and she understands the recommendation that she should speak to a medical oncologist about the possibility of postoperative chemotherapy.  Clinically she is tired. She has some postthoracotomy pain beneath the right breast line. Her mother is in hospice and is actively dying. She has been taking Neurontin to help her sleep at night and will add a daily dose for her postthoracotomy pain.  The patient's chest x-ray shows a small pleural effusion on the right side with well aeration of the right middle and upper lobes.   Past Medical History:  Diagnosis Date  . Anxiety   . Bunion   . CAD (coronary artery disease)   . Gallstones   . GERD (gastroesophageal reflux disease)    Tums  . Hyperlipidemia   . Insomnia   . Lung mass   . Peripheral vascular disease ([place])    2012 Left leg stent    Past Surgical History:  Procedure Laterality Date  . BUNIONECTOMY    . CARDIAC CATHETERIZATION    . CESAREAN SECTION    . CHEST TUBE INSERTION Right 11/04/2016  . CORONARY STENT PLACEMENT    . EYE SURGERY Right    laser  . stent left leg 2012    . VIDEO ASSISTED THORACOSCOPY (VATS)/ LOBECTOMY Right 11/04/2016   Procedure: VIDEO ASSISTED THORACOSCOPY (VATS), THOROCOTOMY, RIGHT LOWER LOBECTOMY;  Surgeon: Ivin Poot, MD;  Location: [facility];  Service: Thoracic;  Laterality: Right;  Marland Kitchen VIDEO BRONCHOSCOPY N/A 11/04/2016   Procedure: VIDEO BRONCHOSCOPY;  Surgeon: Ivin Poot, MD;  Location: [facility] OR;  Service: Thoracic;  Laterality: N/A;    Family History  Problem Relation Age of Onset  . Cancer Mother   . Diabetes Mother   . Heart disease Mother   . Hyperlipidemia Mother   . Hypertension Mother   . Kidney disease Mother   . Stroke Mother   . Cancer Father   . Stroke Father   . Heart disease Brother   . Diabetes Maternal Grandmother   . Hyperlipidemia Maternal Grandmother   . Hypertension Maternal Grandmother   . Kidney disease Maternal Grandmother   . Heart disease Maternal Grandfather     Social History Social History  Substance Use Topics  . Smoking status: Former Smoker    Packs/day: 0.75    Years: 35.00    Quit date: 10/30/2006  . Smokeless tobacco: Never Used  . Alcohol use No    Current Outpatient Prescriptions  Medication Sig Dispense Refill  . ALPRAZolam (XANAX) 0.5 MG tablet Take 0.5 mg by mouth at bedtime.    Jearl Klinefelter ELLIPTA 62.5-25 MCG/INH AEPB Inhale 1 puff into the lungs at bedtime.     . APPLE CIDER VINEGAR PO Take 2  capsules by mouth every morning.    Marland Kitchen aspirin EC 81 MG tablet Take 81 mg by mouth at bedtime.    . Biotin 5000 MCG TABS Take 1 tablet by mouth every morning.    . Coconut Oil (EQL COCONUT OIL) 1000 MG CAPS Take 1 capsule by mouth every morning.    . gabapentin (NEURONTIN) 300 MG capsule Take 900 mg by mouth at bedtime.    . metoprolol succinate (TOPROL-XL) 25 MG 24 hr tablet Take 25 mg by mouth at bedtime.    . rosuvastatin (CRESTOR) 20 MG tablet Take 20 mg by mouth at bedtime.    . Turmeric 500 MG CAPS Take 2 capsules by mouth every morning.     No current facility-administered medications for this visit.     Allergies  Allergen Reactions  .  Lipitor [Atorvastatin]     MUSCLE ACHES    Review of Systems  Complains of fatigue, expected Surgical incisions well-healed sutures removed No productive cough fever or problems with the surgical incisions  BP 105/69   Pulse 76   Resp 16   Ht 5' 3.5" (1.613 m)   Wt 125 lb (56.7 kg)   SpO2 99% Comment: ON RA  BMI 21.80 kg/m  Physical Exam Surgical incision and chest tube sites all well-healed    General- alert and comfortable   Lungs- clear without rales, wheezes   Cor- regular rate and rhythm, no murmur , gallop   Abdomen- soft, non-tender   Extremities - warm, non-tender, minimal edema   Neuro- oriented, appropriate, no focal weakness   Diagnostic Tests: Chest x-ray with expected decreased time of right side after right lower lobectomy otherwise clear  Impression: Doing well but still recovering She should not return to work until mid July-July 16. I will see her back in the office on July 11 to assess her recovery and to review a updated chest x-ray  Plan: Return for review of progress on July 11 with chest x-ray. Patient understands she can drive lift up to 25 pounds and take a bath if needed.   Len Childs, MD [facility] [phone]

## 2016-12-02 ENCOUNTER — Encounter (HOSPITAL_COMMUNITY): Payer: Self-pay

## 2017-01-14 ENCOUNTER — Other Ambulatory Visit: Payer: Self-pay | Admitting: Cardiothoracic Surgery

## 2017-01-14 ENCOUNTER — Encounter: Payer: Self-pay | Admitting: Cardiothoracic Surgery

## 2017-01-14 ENCOUNTER — Ambulatory Visit (INDEPENDENT_AMBULATORY_CARE_PROVIDER_SITE_OTHER): Payer: Self-pay | Admitting: Cardiothoracic Surgery

## 2017-01-14 ENCOUNTER — Ambulatory Visit
Admission: RE | Admit: 2017-01-14 | Discharge: 2017-01-14 | Disposition: A | Discharge status: Home / Self Care | Payer: 59 | Source: Ambulatory Visit | Attending: Thoracic Surgery (Cardiothoracic Vascular Surgery) | Admitting: Thoracic Surgery (Cardiothoracic Vascular Surgery)

## 2017-01-14 VITALS — BP 93/62 | HR 66 | Resp 16 | Ht 63.5 in | Wt 122.0 lb

## 2017-01-14 DIAGNOSIS — Z902 Acquired absence of lung [part of]: Secondary | ICD-10-CM

## 2017-01-14 DIAGNOSIS — C3431 Malignant neoplasm of lower lobe, right bronchus or lung: Secondary | ICD-10-CM

## 2017-01-14 NOTE — Progress Notes (Signed)
PCP is Houston Siren., MD Referring Provider is Gwenevere Ghazi, MD  Chief Complaint  Patient presents with  . 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 The patient is been consulted by an oncologist at [facility] and has not yet decided regarding adjunctive chemotherapy. Molecular markers of the tumor were submitted to [facility] and no common mutations which could be targeted by therapy were identified  The patient has had some problems with fatigue and low energy with the heat. Some shortness of breath with exertion The patient is not resumed smoking. The patient still has a small right pleural effusion on x-ray probably related to the space left by the right lower lobectomy and the lack of compliance in her remaining middle and upper lobes to fill the space.  Most all the patient is very depressed and feeling the emotional burden of having her mother die of gastric cancer 3 weeks after she was discharged from her lobectomy for lung cancer. The patient spent those 3 weeks at her bedside in hospice and she was recovering from her own surgery.  Past Medical History:  Diagnosis Date  . Anxiety   . Bunion   . CAD (coronary artery disease)   . Gallstones   . GERD (gastroesophageal reflux disease)    Tums  . Hyperlipidemia   . Insomnia   . Lung mass   . Peripheral vascular disease ([place])    2012 Left leg stent    Past Surgical History:  Procedure Laterality Date  . BUNIONECTOMY    . CARDIAC CATHETERIZATION    . CESAREAN SECTION    . CHEST TUBE INSERTION Right 11/04/2016  . CORONARY STENT PLACEMENT    . EYE SURGERY Right    laser  . stent left leg 2012    . VIDEO ASSISTED THORACOSCOPY (VATS)/ LOBECTOMY Right 11/04/2016   Procedure: VIDEO ASSISTED THORACOSCOPY (VATS), THOROCOTOMY, RIGHT LOWER LOBECTOMY;  Surgeon: Ivin Poot, MD;   Location: [facility];  Service: Thoracic;  Laterality: Right;  Marland Kitchen VIDEO BRONCHOSCOPY N/A 11/04/2016   Procedure: VIDEO BRONCHOSCOPY;  Surgeon: Ivin Poot, MD;  Location: [facility] OR;  Service: Thoracic;  Laterality: N/A;    Family History  Problem Relation Age of Onset  . Cancer Mother   . Diabetes Mother   . Heart disease Mother   . Hyperlipidemia Mother   . Hypertension Mother   . Kidney disease Mother   . Stroke Mother   . Cancer Father   . Stroke Father   . Heart disease Brother   . Diabetes Maternal Grandmother   . Hyperlipidemia Maternal Grandmother   . Hypertension Maternal Grandmother   . Kidney disease Maternal Grandmother   . Heart disease Maternal Grandfather     Social History Social History  Substance Use Topics  . Smoking status: Former Smoker    Packs/day: 0.75    Years: 35.00    Quit date: 10/30/2006  . Smokeless tobacco: Never Used  . Alcohol use No    Current Outpatient Prescriptions  Medication Sig Dispense Refill  . ALPRAZolam (XANAX) 0.5 MG tablet Take 0.5 mg by mouth at bedtime.    Jearl Klinefelter ELLIPTA 62.5-25 MCG/INH AEPB Inhale 1 puff into the lungs at bedtime.     Marland Kitchen aspirin EC 81 MG tablet Take 81 mg by mouth at bedtime.    Marland Kitchen  gabapentin (NEURONTIN) 300 MG capsule Take 900 mg by mouth at bedtime.    . metoprolol succinate (TOPROL-XL) 25 MG 24 hr tablet Take 25 mg by mouth at bedtime.    . rosuvastatin (CRESTOR) 20 MG tablet Take 20 mg by mouth at bedtime.    . APPLE CIDER VINEGAR PO Take 2 capsules by mouth every morning.    . Biotin 5000 MCG TABS Take 1 tablet by mouth every morning.    . Coconut Oil (EQL COCONUT OIL) 1000 MG CAPS Take 1 capsule by mouth every morning.     No current facility-administered medications for this visit.     Allergies  Allergen Reactions  . Lipitor [Atorvastatin]     MUSCLE ACHES    Review of Systems   Having trouble getting to sleep No weight loss No fever or productive cough Some postthoracotomy pain and numbness  beneath the right breast line  BP 93/62 (BP Location: Left Arm, Patient Position: Sitting, Cuff Size: Normal)   Pulse 66   Resp 16   Ht 5' 3.5" (1.613 m)   Wt 122 lb (55.3 kg)   SpO2 98% Comment: ON RA  BMI 21.27 kg/m  Physical Exam       Exam    General- alert and comfortable   Lungs- clear without rales, wheezes   Cor- regular rate and rhythm, no murmur , gallop   Abdomen- soft, non-tender   Extremities - warm, non-tender, minimal edema   Neuro- oriented, appropriate, no focal weakness   Diagnostic Tests: Chest x-ray personally reviewed showing some loss of volume in the right lung after right lower lobectomy. No new suspicious lesions  Impression: Slow progress due  to both her significant operation and emotional burden of her mother's death. She will try to find a psychotherapist in her own community. She is adverse to taking antidepressant medication.  Plan:Patient is not ready to return to her work schedule which is very high stress and demanding. She needs another 2 months to rehabilitation from her right thoracotomy and right lower lobectomy. I'll see her back in the office in 2 months. I will reassess her ability to return to work at that time.   Len Childs, MD [facility] [phone]

## 2017-01-16 ENCOUNTER — Telehealth: Payer: Self-pay

## 2017-01-16 NOTE — Telephone Encounter (Signed)
Nixie Zavada FMLA/STD was extended to 04/14/2017. ( see office visit note from 01/14/17) with [employer]. She has follow up appt on 04/08/2017. The information was faxed to [facility]

## 2017-03-18 ENCOUNTER — Other Ambulatory Visit: Payer: Self-pay | Admitting: Cardiothoracic Surgery

## 2017-03-18 DIAGNOSIS — Z902 Acquired absence of lung [part of]: Secondary | ICD-10-CM

## 2017-03-19 ENCOUNTER — Encounter: Payer: Self-pay | Admitting: Cardiothoracic Surgery

## 2017-04-02 ENCOUNTER — Ambulatory Visit (INDEPENDENT_AMBULATORY_CARE_PROVIDER_SITE_OTHER): Payer: 59 | Admitting: Cardiothoracic Surgery

## 2017-04-02 ENCOUNTER — Encounter: Payer: Self-pay | Admitting: Cardiothoracic Surgery

## 2017-04-02 VITALS — BP 91/65 | HR 60 | Resp 18 | Ht 63.5 in | Wt 120.0 lb

## 2017-04-02 DIAGNOSIS — C3431 Malignant neoplasm of lower lobe, right bronchus or lung: Secondary | ICD-10-CM | POA: Diagnosis not present

## 2017-04-02 DIAGNOSIS — Z902 Acquired absence of lung [part of]: Secondary | ICD-10-CM | POA: Diagnosis not present

## 2017-04-02 NOTE — Progress Notes (Signed)
PCP is Houston Siren., MD Referring Provider is Gwenevere Ghazi, MD  Chief Complaint  Patient presents with  . Routine Post Op    2 month f/u with CXR 03/19/17, need to discuss RTW date    WUJ:WJXBJ postop visit 5 months after right VATS, right lower lobectomy with lymph node dissection. The patient had a 3 cm adenocarcinoma T2 N0. She is now followed by an oncologist, Dr. Harlow Asa at [facility]. She is a nonsmoker. She has still postthoracotomy pain but this is improving. She has significant stress induced anxiety from her battle with lung cancer, surgery, and recent death of her mother. Her preoperative PFTs were FVC 87%, FEV1 80%, and DLCO 70% predicted.  Her postoperative chest x-rays have shown a elevated right hemidiaphragm from volume loss and a probable small pleural effusion. She will need a CT scan of the chest proximal to 6 months following surgery and this will be performed by her oncologist Dr. Harlow Asa.  Past Medical History:  Diagnosis Date  . Anxiety   . Bunion   . CAD (coronary artery disease)   . Gallstones   . GERD (gastroesophageal reflux disease)    Tums  . Hyperlipidemia   . Insomnia   . Lung mass   . Peripheral vascular disease ([place])    2012 Left leg stent    Past Surgical History:  Procedure Laterality Date  . BUNIONECTOMY    . CARDIAC CATHETERIZATION    . CESAREAN SECTION    . CHEST TUBE INSERTION Right 11/04/2016  . CORONARY STENT PLACEMENT    . EYE SURGERY Right    laser  . stent left leg 2012    . VIDEO ASSISTED THORACOSCOPY (VATS)/ LOBECTOMY Right 11/04/2016   Procedure: VIDEO ASSISTED THORACOSCOPY (VATS), THOROCOTOMY, RIGHT LOWER LOBECTOMY;  Surgeon: Ivin Poot, MD;  Location: [facility];  Service: Thoracic;  Laterality: Right;  Marland Kitchen VIDEO BRONCHOSCOPY N/A 11/04/2016   Procedure: VIDEO BRONCHOSCOPY;  Surgeon: Ivin Poot, MD;  Location: [facility] OR;  Service: Thoracic;  Laterality: N/A;    Family History  Problem Relation Age of Onset  . Cancer  Mother   . Diabetes Mother   . Heart disease Mother   . Hyperlipidemia Mother   . Hypertension Mother   . Kidney disease Mother   . Stroke Mother   . Cancer Father   . Stroke Father   . Heart disease Brother   . Diabetes Maternal Grandmother   . Hyperlipidemia Maternal Grandmother   . Hypertension Maternal Grandmother   . Kidney disease Maternal Grandmother   . Heart disease Maternal Grandfather     Social History Social History  Substance Use Topics  . Smoking status: Former Smoker    Packs/day: 0.75    Years: 35.00    Quit date: 10/30/2006  . Smokeless tobacco: Never Used  . Alcohol use No    Current Outpatient Prescriptions  Medication Sig Dispense Refill  . ALPRAZolam (XANAX) 0.5 MG tablet Take 0.5 mg by mouth at bedtime.    Jearl Klinefelter ELLIPTA 62.5-25 MCG/INH AEPB Inhale 1 puff into the lungs at bedtime.     . APPLE CIDER VINEGAR PO Take 2 capsules by mouth every morning.    Marland Kitchen aspirin EC 81 MG tablet Take 81 mg by mouth at bedtime.    . Biotin 5000 MCG TABS Take 1 tablet by mouth every morning.    . Coconut Oil (EQL COCONUT OIL) 1000 MG CAPS Take 1 capsule by mouth every morning.    Marland Kitchen  gabapentin (NEURONTIN) 300 MG capsule Take 900 mg by mouth at bedtime.    . metoprolol succinate (TOPROL-XL) 25 MG 24 hr tablet Take 25 mg by mouth at bedtime.    . rosuvastatin (CRESTOR) 20 MG tablet Take 20 mg by mouth at bedtime.    . promethazine (PHENERGAN) 25 MG tablet take 1 tablet by mouth every 6 hours if needed for 7 days for nausea  0   No current facility-administered medications for this visit.     Allergies  Allergen Reactions  . Lipitor [Atorvastatin]     MUSCLE ACHES    Review of Systems The patient was hospitalized at [facility] for gastroenteritis but now feels much better She has postthoracotomy pain and symptomatology with swelling sensation and weakness of her right torso. No productive cough No syncope Surgical incision has healed  BP 91/65   Pulse  60   Resp 18   Ht 5' 3.5" (1.613 m)   Wt 120 lb (54.4 kg)   SpO2 97% Comment: RA  BMI 20.92 kg/m  Physical Exam      Exam    General- alert and comfortable   Lungs- clear without rales, wheezes   Cor- regular rate and rhythm, no murmur , gallop   Abdomen- soft, non-tender   Extremities - warm, non-tender, minimal edema   Neuro- oriented, appropriate, no focal weakness   Diagnostic Tests: Chest x-ray taken earlier this summer  at [facility] revealed stable postop chest x-ray with volume loss of the right base  Impression: Doing well 5 months after right lower lobectomy for adenocarcinoma stage Ib  Plan: Surveillance scans would be scheduled by her oncologist  The patient will be reevaluated here as needed 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   Len Childs, MD [facility] [phone]

## 2018-04-16 IMAGING — CR DG CHEST 2V
2 series · 2 of 2 positions shown · non-contrast
Comparison: 10/17/2016

CLINICAL DATA: Preop evaluation for upcoming lung surgery

EXAM:
CHEST  2 VIEW

[w chest pa]
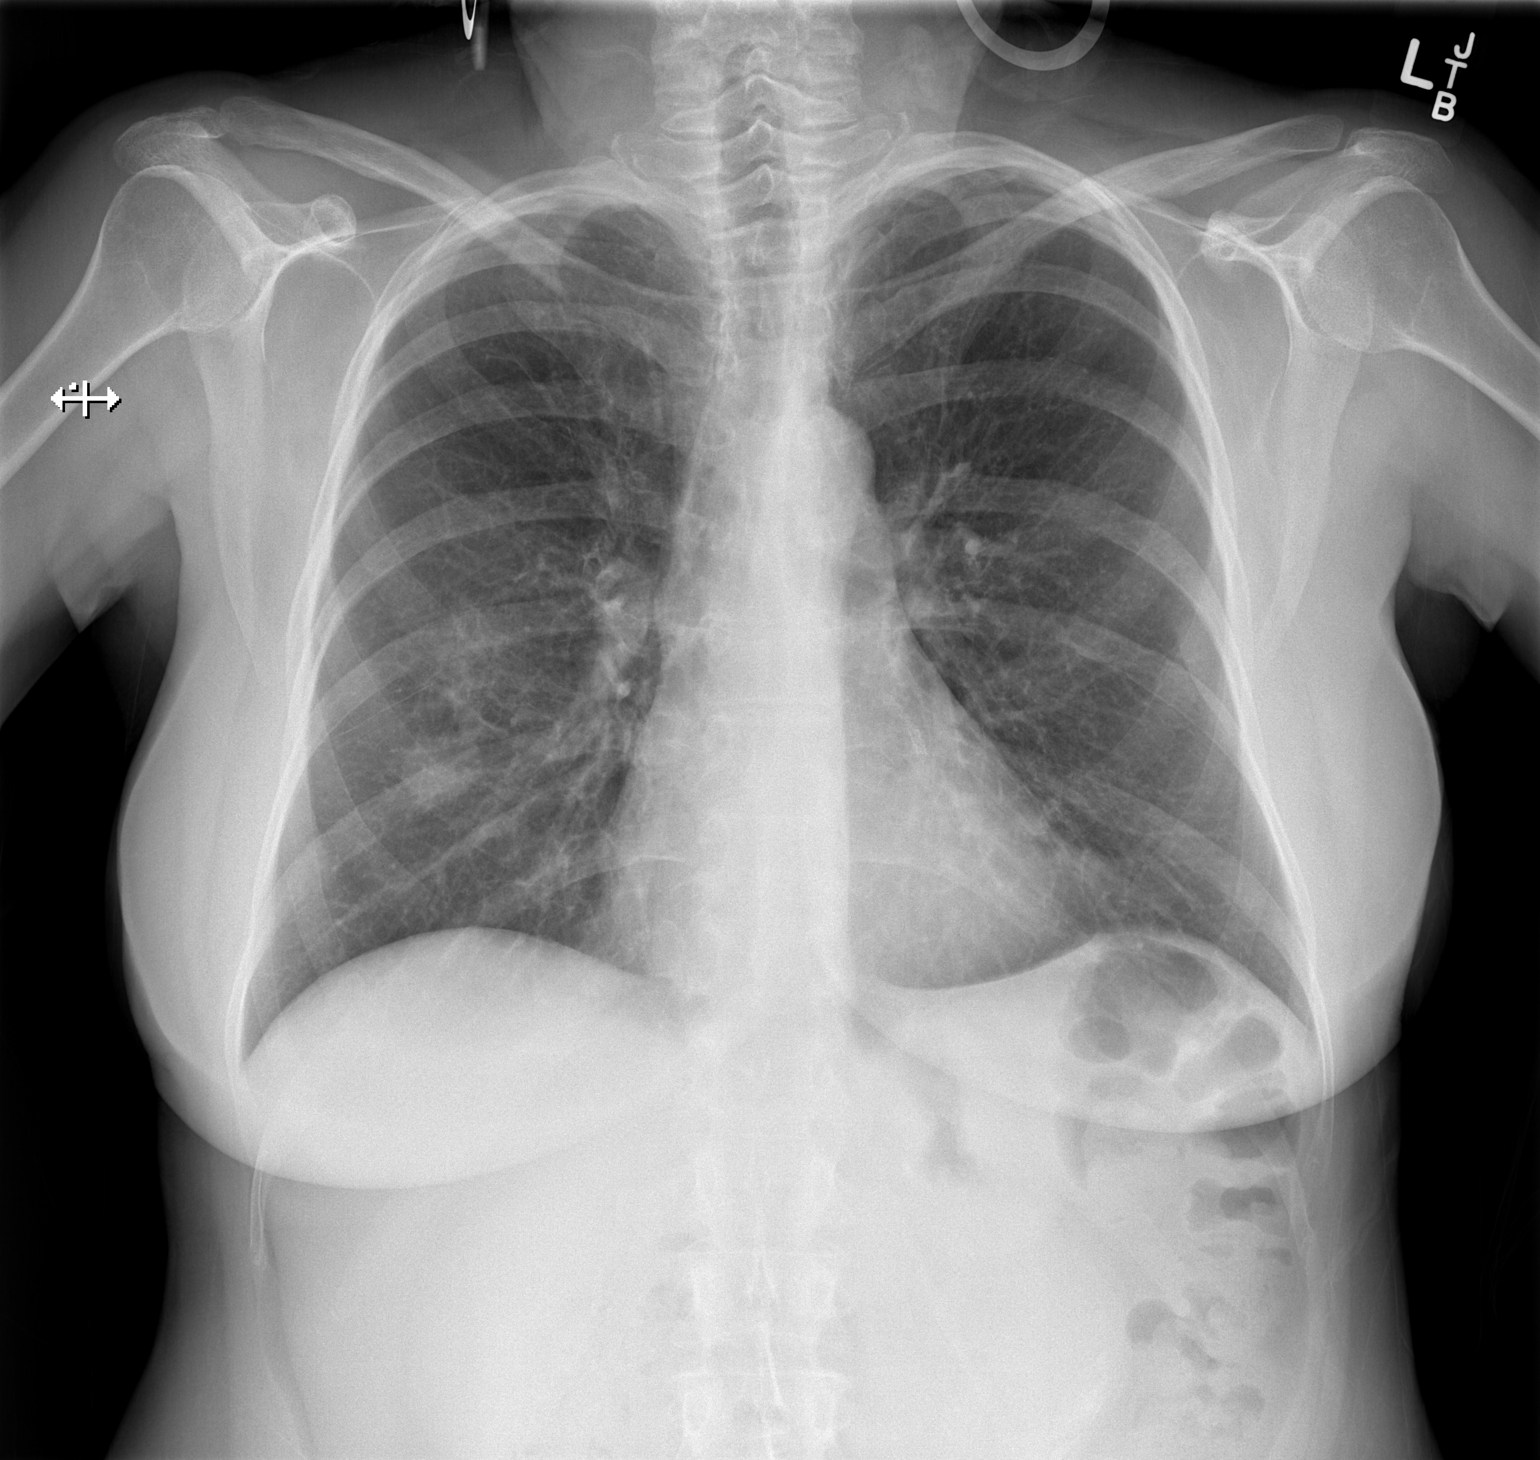

[w chest lat]
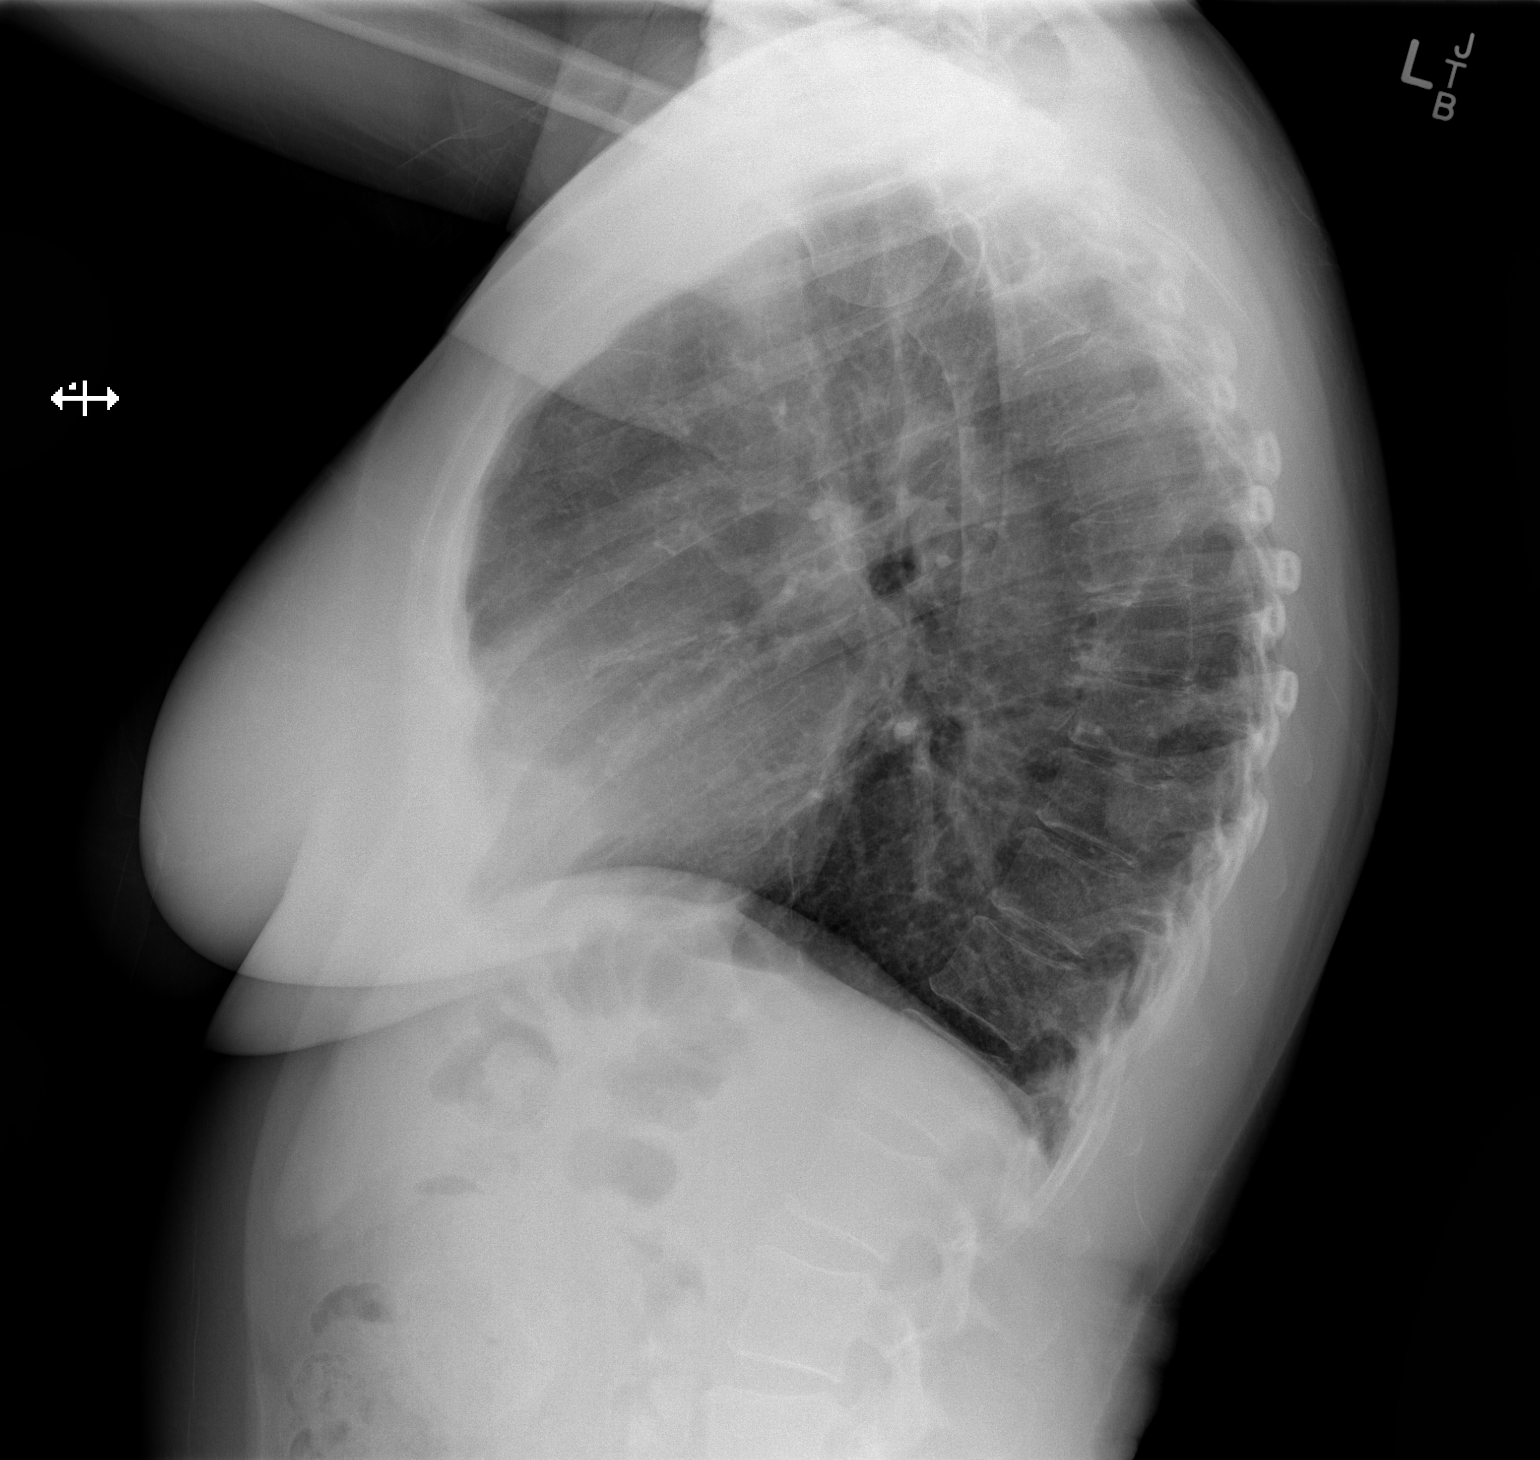

[2 of 2 positions shown; findings below may reference images not displayed]

FINDINGS: Cardiac shadow is within normal limits. The lungs are well aerated
bilaterally. Nodular density is noted projecting over the right
lower lobe consistent with that seen on prior CT examination. No
sizable effusion is seen. No bony abnormality is noted.
IMPRESSION: Right lower lobe nodule stable from previous exams.

## 2018-04-19 IMAGING — CR DG CHEST 1V PORT
1 series · 1 of 1 positions shown · non-contrast
Comparison: 11/01/2016 .

CLINICAL DATA: Prior right lung surgery.

EXAM:
PORTABLE CHEST 1 VIEW

[AP]
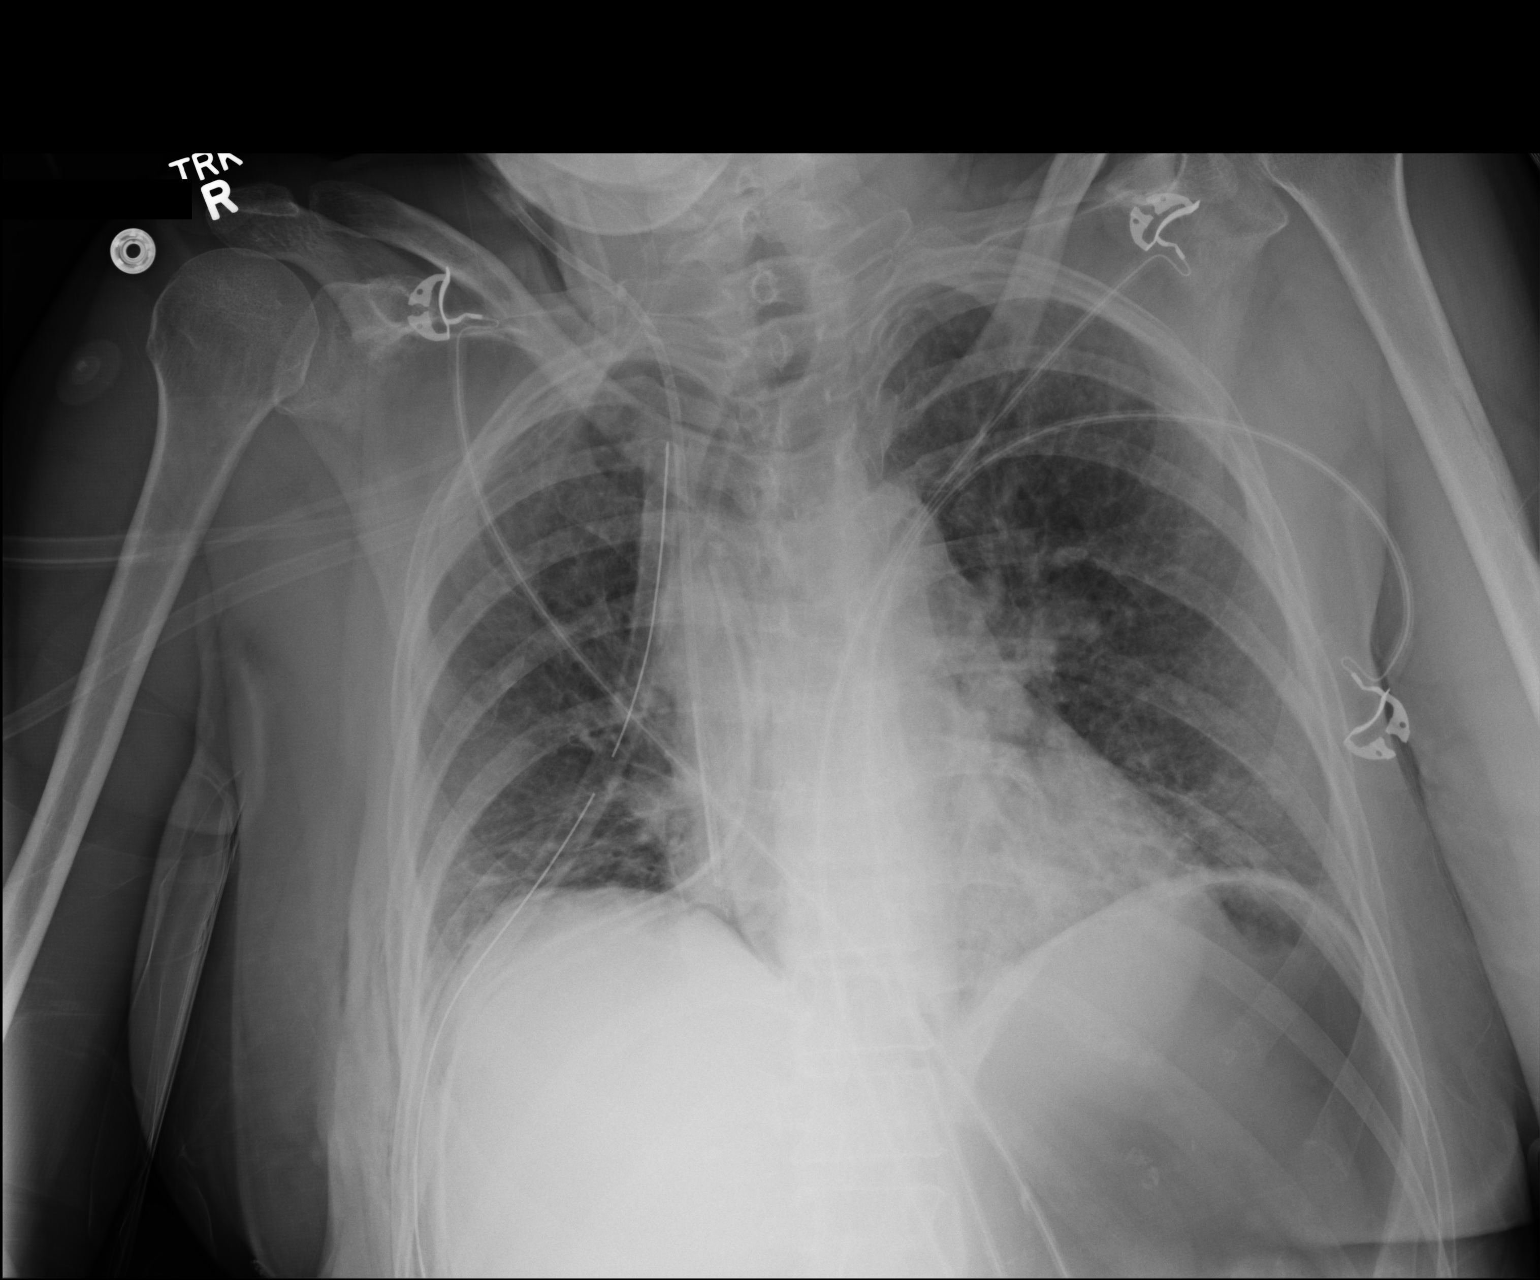

[1 of 1 positions shown; findings below may reference images not displayed]

FINDINGS: Postsurgical changes right chest, previous identified nodule in the
right lung base no longer identified. Right IJ line noted with tip
projected over the right atrium. Two right chest tubes noted. No
pneumothorax. Low lung volumes basilar atelectasis. Mild basilar
infiltrates cannot be excluded. Heart size normal. Gastric
distention.
IMPRESSION: 1. Right IJ line noted with tip projected over right atrium. Two
right chest tube noted good anatomic position. No pneumothorax.
Postsurgical changes right chest. Right lung pulmonary nodule no
longer identified.

2. Low lung volumes with basilar atelectasis. Mild basilar
infiltrates cannot be excluded .

3.  Gastric distention.

## 2018-04-20 IMAGING — CR DG CHEST 1V PORT
1 series · 1 of 1 positions shown · non-contrast
Comparison: Yesterday

CLINICAL DATA: Chest soreness after surgery

EXAM:
PORTABLE CHEST 1 VIEW

[AP]
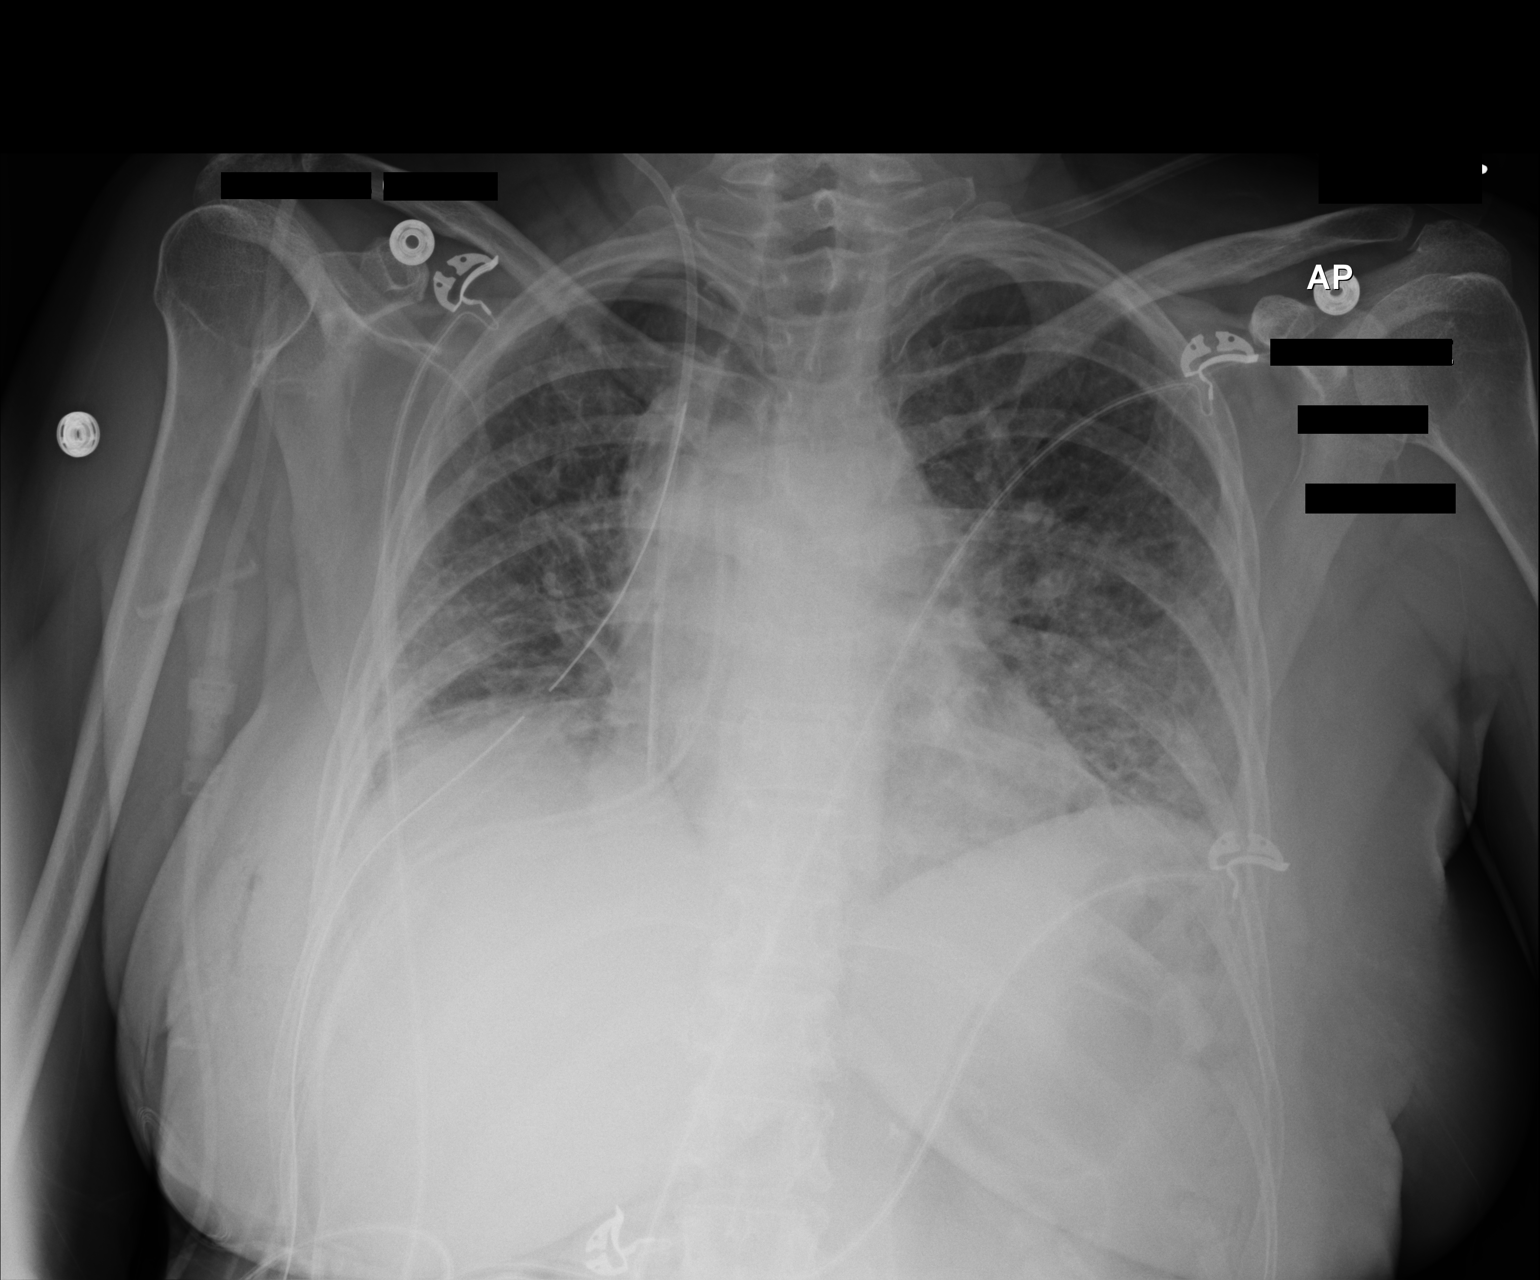

[1 of 1 positions shown; findings below may reference images not displayed]

FINDINGS: Right-sided chest tube in unchanged position. Right IJ central line
with tip at the upper right atrium.

Interstitial coarsening which could be atelectasis or mild edema. No
effusion or visible pneumothorax. Postoperative volume loss and
mediastinal distortion on the right.
IMPRESSION: 1. Congestive or atelectatic interstitial coarsening.
2. No visible pneumothorax.

## 2018-04-23 IMAGING — DX DG CHEST 2V
2 series · 2 of 2 positions shown · non-contrast
Comparison: 11/08/2016

CLINICAL DATA: Chest tube removal.

EXAM:
CHEST  2 VIEW

[chest pa]
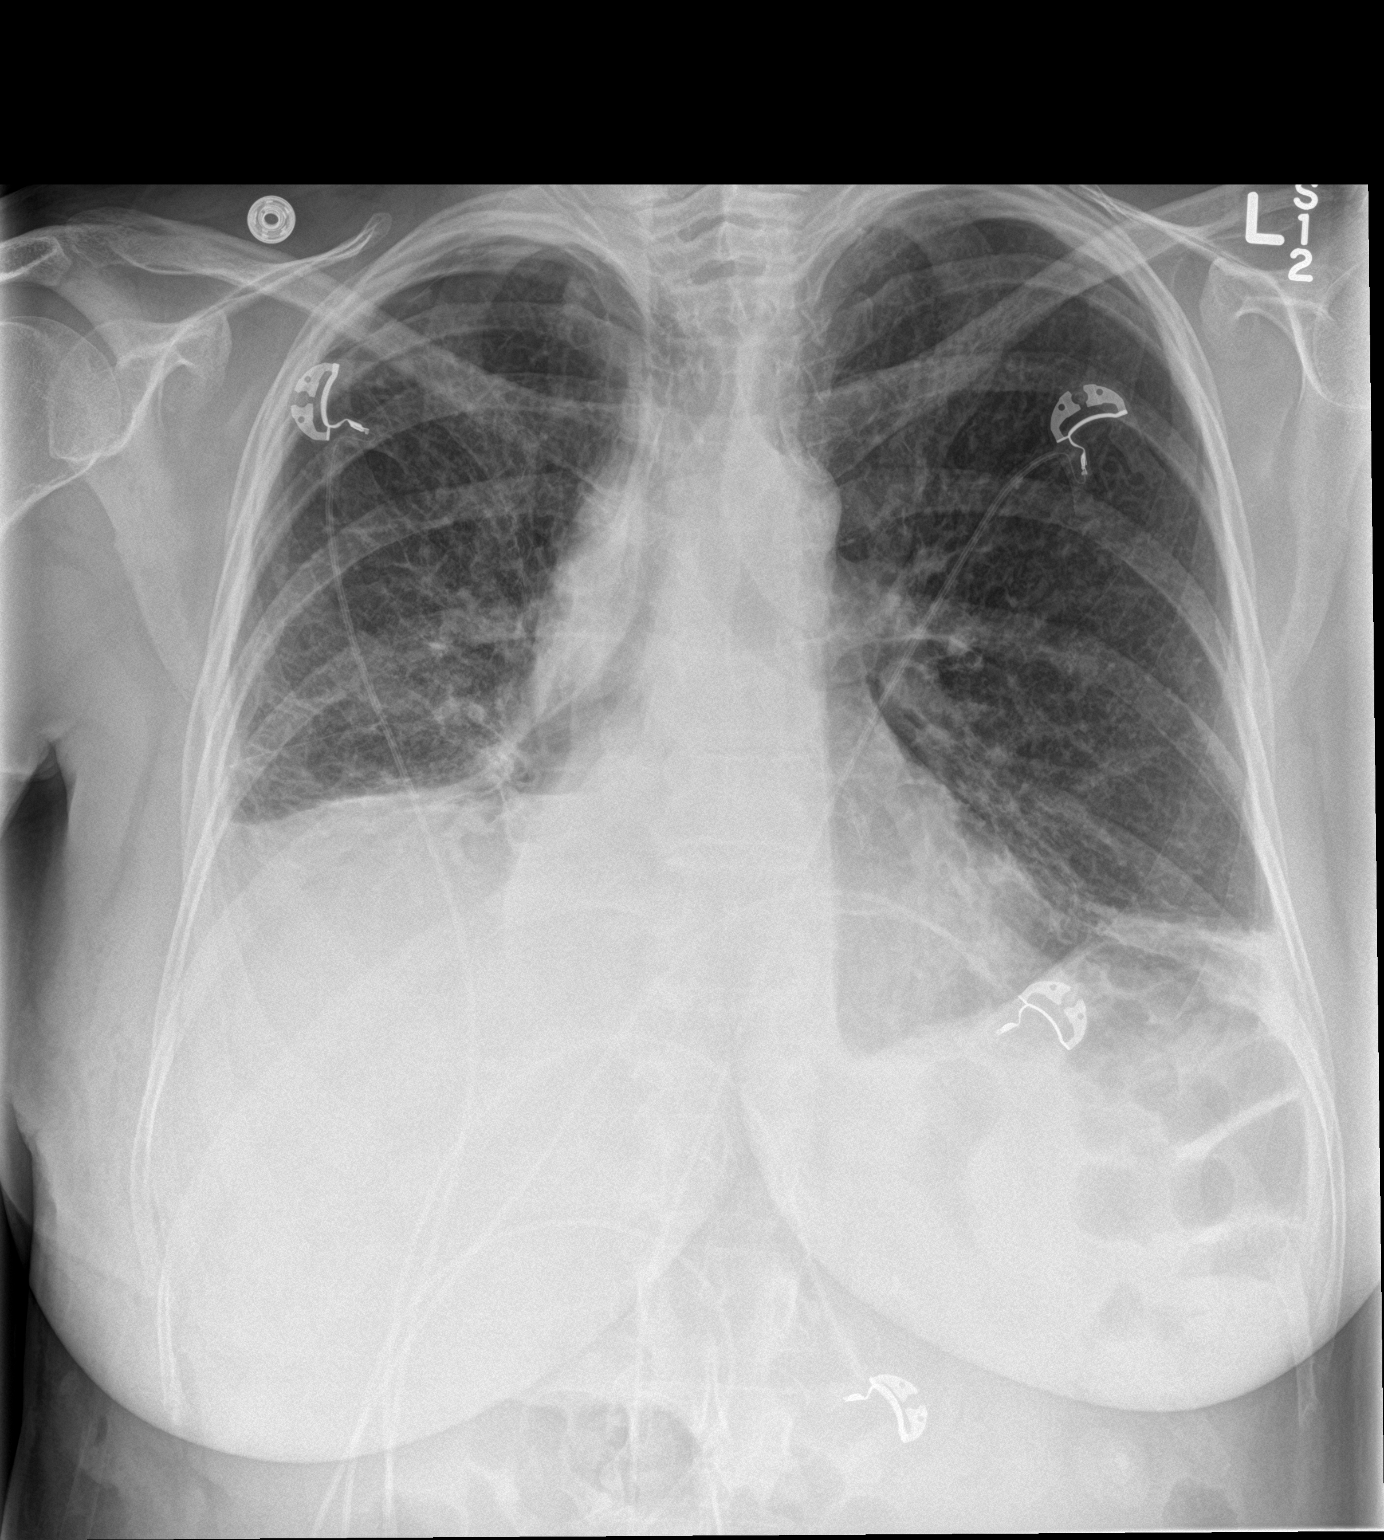

[chest lat]
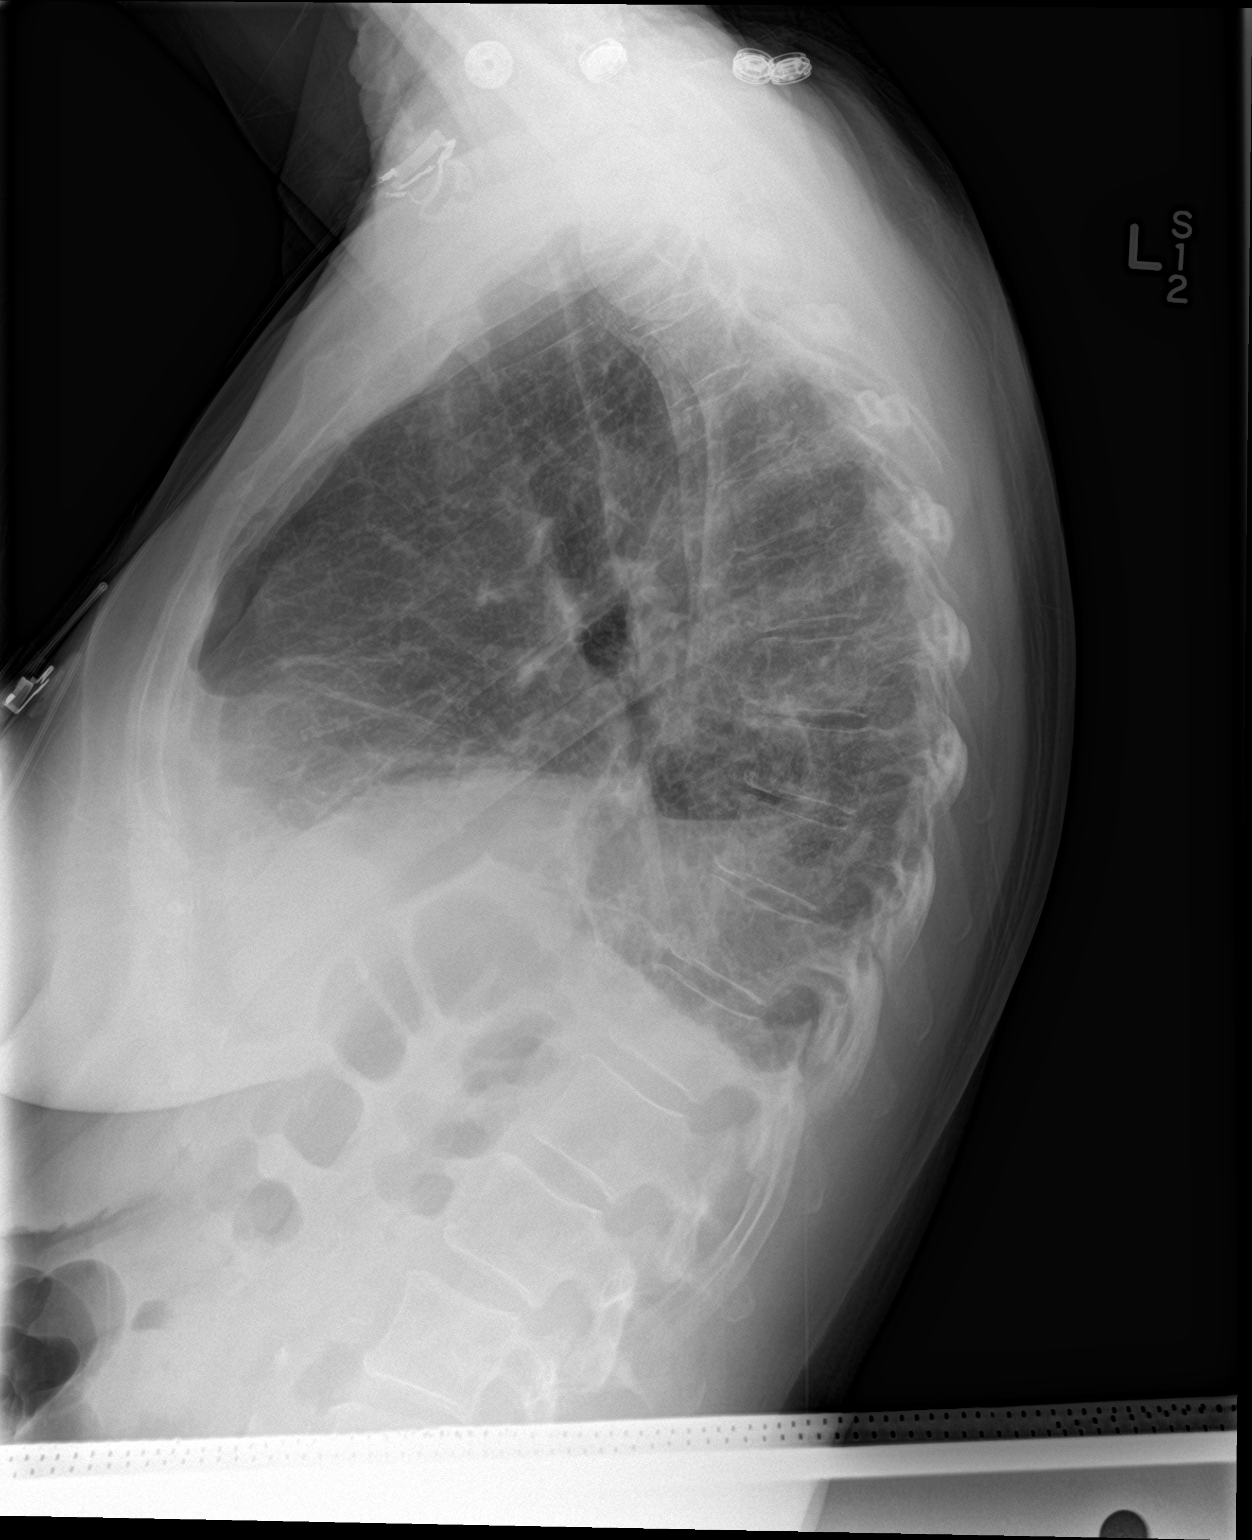

[2 of 2 positions shown; findings below may reference images not displayed]

FINDINGS: Interval removal of the right chest tube. Small residual right
apical pneumothorax, approximately 5%. Small right effusion and
bibasilar atelectasis again noted, stable.
IMPRESSION: Interval removal of right chest tube with small residual right
pneumothorax, unchanged.

Bibasilar atelectasis and small right effusion, stable.
